# Patient Record
Sex: Male | Born: 1980 | Hispanic: Yes | Marital: Single | Smoking: Current every day smoker
Health system: Southern US, Community
[De-identification: ages and names within clinical notes are randomized; demographics above are authoritative.]

## PROBLEM LIST (undated history)

## (undated) DIAGNOSIS — J45909 Unspecified asthma, uncomplicated: Secondary | ICD-10-CM

## (undated) DIAGNOSIS — L0390 Cellulitis, unspecified: Secondary | ICD-10-CM

## (undated) HISTORY — PX: NO PAST SURGERIES: SHX2092

---

## 2015-09-03 DIAGNOSIS — L0390 Cellulitis, unspecified: Secondary | ICD-10-CM

## 2015-09-03 HISTORY — DX: Cellulitis, unspecified: L03.90

## 2015-09-08 ENCOUNTER — Emergency Department (HOSPITAL_COMMUNITY)
Admission: EM | Admit: 2015-09-08 | Discharge: 2015-09-09 | Disposition: A | Discharge status: Home / Self Care | Payer: Self-pay | Attending: Emergency Medicine | Admitting: Emergency Medicine

## 2015-09-08 ENCOUNTER — Encounter (HOSPITAL_COMMUNITY): Payer: Self-pay | Admitting: Emergency Medicine

## 2015-09-08 DIAGNOSIS — J020 Streptococcal pharyngitis: Secondary | ICD-10-CM | POA: Insufficient documentation

## 2015-09-08 DIAGNOSIS — J45909 Unspecified asthma, uncomplicated: Secondary | ICD-10-CM | POA: Insufficient documentation

## 2015-09-08 DIAGNOSIS — Z872 Personal history of diseases of the skin and subcutaneous tissue: Secondary | ICD-10-CM | POA: Insufficient documentation

## 2015-09-08 DIAGNOSIS — F17200 Nicotine dependence, unspecified, uncomplicated: Secondary | ICD-10-CM | POA: Insufficient documentation

## 2015-09-08 HISTORY — DX: Unspecified asthma, uncomplicated: J45.909

## 2015-09-08 LAB — RAPID STREP SCREEN (MED CTR MEBANE ONLY): STREPTOCOCCUS, GROUP A SCREEN (DIRECT): POSITIVE — AB

## 2015-09-08 MED ORDER — ACETAMINOPHEN 325 MG PO TABS
ORAL_TABLET | ORAL | Status: AC
  Administered 2015-09-08: 650 mg via ORAL
  Filled 2015-09-08: qty 2

## 2015-09-08 MED ORDER — PENICILLIN G BENZATHINE 1200000 UNIT/2ML IM SUSP
1.2000 10*6.[IU] | Freq: Once | INTRAMUSCULAR | Status: AC
  Administered 2015-09-09: 1.2 10*6.[IU] via INTRAMUSCULAR
  Filled 2015-09-08: qty 2

## 2015-09-08 MED ORDER — ACETAMINOPHEN 325 MG PO TABS
650.0000 mg | ORAL_TABLET | Freq: Once | ORAL | Status: AC
  Administered 2015-09-08: 650 mg via ORAL

## 2015-09-08 MED ORDER — KETOROLAC TROMETHAMINE 60 MG/2ML IM SOLN
60.0000 mg | Freq: Once | INTRAMUSCULAR | Status: AC
  Administered 2015-09-09: 60 mg via INTRAMUSCULAR
  Filled 2015-09-08: qty 2

## 2015-09-08 MED ORDER — IBUPROFEN 800 MG PO TABS: 800.0000 mg | ORAL_TABLET | Freq: Three times a day (TID) | ORAL | Status: DC

## 2015-09-08 NOTE — ED Provider Notes (Signed)
CSN: [id number]     Arrival date & time 09/08/15  1840 History  By signing my name below, I, Bethel Born, attest that this documentation has been prepared under the direction and in the presence of Gilda Crease, MD. Electronically Signed: Bethel Born, ED Scribe. 09/08/2015. 12:00 AM  Chief Complaint  Patient presents with  . Sore Throat  . Fever  . Headache    The history is provided by the patient. No language interpreter was used.   Joshua Keith is a 35 y.o. male who presents to the Emergency Department complaining of new and constant sore throat with gradual onset 4 days ago. Associated symptoms include fever, headache, and myalgias. Pt denies cough.   Past Medical History  Diagnosis Date  . Asthma    History reviewed. No pertinent past surgical history. No family history on file. Social History  Substance Use Topics  . Smoking status: Current Every Day Smoker  . Smokeless tobacco: None  . Alcohol Use: Yes    Review of Systems  Constitutional: Positive for fever.  HENT: Positive for sore throat.   Respiratory: Negative for cough.   Musculoskeletal: Positive for myalgias.  Neurological: Positive for headaches.  All other systems reviewed and are negative.   Allergies  Review of patient's allergies indicates no known allergies.  Home Medications   Prior to Admission medications   Medication Sig Start Date End Date Taking? Authorizing Provider  ibuprofen (ADVIL,MOTRIN) 800 MG tablet Take 1 tablet (800 mg total) by mouth 3 (three) times daily. 09/08/15   Gilda Crease, MD   BP 120/64 mmHg  Pulse 95  Temp(Src) 98.4 F (36.9 C) (Oral)  Resp 16  Wt 221 lb 2 oz (100.302 kg)  SpO2 100% Physical Exam  Constitutional: He is oriented to person, place, and time. He appears well-developed and well-nourished. No distress.  HENT:  Head: Normocephalic and atraumatic.  Right Ear: Hearing normal.  Left Ear: Hearing normal.  Nose: Nose  normal.  Mouth/Throat: Oropharynx is clear and moist and mucous membranes are normal.  Erythema diffusely of the soft palette and tonsils.   Eyes: Conjunctivae and EOM are normal. Pupils are equal, round, and reactive to light.  Neck: Normal range of motion. Neck supple.  Cardiovascular: Regular rhythm, S1 normal and S2 normal.  Exam reveals no gallop and no friction rub.   No murmur heard. Pulmonary/Chest: Effort normal and breath sounds normal. No respiratory distress. He exhibits no tenderness.  Abdominal: Soft. Normal appearance and bowel sounds are normal. There is no hepatosplenomegaly. There is no tenderness. There is no rebound, no guarding, no tenderness at McBurney's point and negative Murphy's sign. No hernia.  Musculoskeletal: Normal range of motion.  Lymphadenopathy:    He has cervical adenopathy (left anterior).  Neurological: He is alert and oriented to person, place, and time. He has normal strength. No cranial nerve deficit or sensory deficit. Coordination normal. GCS eye subscore is 4. GCS verbal subscore is 5. GCS motor subscore is 6.  Skin: Skin is warm, dry and intact. No rash noted. No cyanosis.  Psychiatric: He has a normal mood and affect. His speech is normal and behavior is normal. Thought content normal.  Nursing note and vitals reviewed.   ED Course  Procedures (including critical care time) DIAGNOSTIC STUDIES: Oxygen Saturation is 100% on RA,  normal by my interpretation.    COORDINATION OF CARE: 11:58 PM Discussed treatment plan which includes rapid strep screen, Bicillin, and Toradol with pt at  bedside and pt agreed to plan.  Labs Review Labs Reviewed  RAPID STREP SCREEN (NOT AT [facility]) - Abnormal; Notable for the following:    Streptococcus, Group A Screen (Direct) POSITIVE (*)    All other components within normal limits    Imaging Review No results found. I have personally reviewed and evaluated these  lab results as part of my medical  decision-making.   EKG Interpretation None      MDM   Final diagnoses:  Strep throat   I personally performed the services described in this documentation, which was scribed in my presence. The recorded information has been reviewed and is accurate.     Gilda Crease, MD 09/09/15 [phone]

## 2015-09-08 NOTE — ED Notes (Signed)
Pt. reports left sore throat with headache and fever onset yesterday .

## 2015-09-08 NOTE — Discharge Instructions (Signed)
Dolor de garganta  (Sore Throat)  El dolor de garganta es el dolor, ardor, irritacin o sensacin de picazn en la garganta. Generalmente hay dolor o molestias al tragar o hablar. Un dolor de garganta puede estar acompaado de otros sntomas, como tos, estornudos, fiebre y ganglios hinchados en el cuello. Generalmente es el primer signo de otra enfermedad, como un resfrio, gripe, anginas o mononucleosis (conocida como mono). La mayor parte de los dolores de garganta desaparecen sin tratamiento mdico. CAUSAS  Las causas ms comunes de dolor de garganta son:   Infecciones virales, como un resfrio, gripe o mononucleosis.  Infeccin bacteriana, como faringitis estreptoccica, amigdalitis, o tos ferina.  Alergias estacionales.  La sequedad en el aire.  Algunos irritantes, como el humo o la polucin.  Reflujo gastroesofgico. INSTRUCCIONES PARA EL CUIDADO EN EL HOGAR   Tome slo la medicacin que le indic el mdico.  Debe ingerir gran cantidad de lquido para mantener la orina de tono claro o color amarillo plido.  Descanse todo lo que sea necesario.  Trate de usar aerosoles para la garganta, pastillas o chupe caramelos duros para aliviar el dolor (si es mayor de 4 aos o segn lo que le indiquen).  Beba lquidos calientes, como caldos, infusiones de hierbas o agua caliente con miel para calmar el dolor momentneamente. Tambin puede comer o beber lquidos fros o congelados tales como paletas de hielo congelado.  Haga grgaras con agua con sal (mezclar 1 cucharadita de sal en 8 onzas [250 cm3] de agua).  No fume, y evite el humo de otros fumadores.  Ponga un humidificador de vapor fro en la habitacin por la noche para humedecer el aire. Tambin se puede activar en una ducha de agua caliente y sentarse en el bao con la puerta cerrada durante 5-10 minutos. SOLICITE ATENCIN MDICA DE INMEDIATO SI:   Tiene dificultad para respirar.  No puede tragar lquidos, alimentos blandos, o  su saliva.  Usted tiene ms inflamacin en la garganta.  El dolor de garganta no mejora en 7 das.  Tiene nuseas o vmitos.  Tiene fiebre o sntomas que persisten durante ms de 2 o 3 das.  Tiene fiebre y los sntomas empeoran de manera sbita. ASEGRESE DE QUE:   Comprende estas instrucciones.  Controlar su enfermedad.  Solicitar ayuda de inmediato si no mejora o si empeora.   Esta informacin no tiene como fin reemplazar el consejo del mdico. Asegrese de hacerle al mdico cualquier pregunta que tenga.   Document Released: 07/19/2005 Document Revised: 07/05/2012 Elsevier Interactive Patient Education 2016 [employer]  

## 2015-09-10 ENCOUNTER — Emergency Department (HOSPITAL_COMMUNITY): Payer: Self-pay

## 2015-09-10 ENCOUNTER — Encounter (HOSPITAL_COMMUNITY): Payer: Self-pay | Admitting: Family Medicine

## 2015-09-10 ENCOUNTER — Observation Stay (HOSPITAL_COMMUNITY)
Admission: EM | Admit: 2015-09-10 | Discharge: 2015-09-11 | Disposition: A | Discharge status: Home / Self Care | Payer: Self-pay | Attending: Internal Medicine | Admitting: Internal Medicine

## 2015-09-10 DIAGNOSIS — R109 Unspecified abdominal pain: Secondary | ICD-10-CM | POA: Insufficient documentation

## 2015-09-10 DIAGNOSIS — R000 Tachycardia, unspecified: Secondary | ICD-10-CM | POA: Insufficient documentation

## 2015-09-10 DIAGNOSIS — M546 Pain in thoracic spine: Secondary | ICD-10-CM

## 2015-09-10 DIAGNOSIS — B9689 Other specified bacterial agents as the cause of diseases classified elsewhere: Secondary | ICD-10-CM

## 2015-09-10 DIAGNOSIS — L0390 Cellulitis, unspecified: Secondary | ICD-10-CM

## 2015-09-10 DIAGNOSIS — R740 Nonspecific elevation of levels of transaminase and lactic acid dehydrogenase [LDH]: Secondary | ICD-10-CM

## 2015-09-10 DIAGNOSIS — J189 Pneumonia, unspecified organism: Secondary | ICD-10-CM | POA: Diagnosis present

## 2015-09-10 DIAGNOSIS — M25511 Pain in right shoulder: Secondary | ICD-10-CM

## 2015-09-10 DIAGNOSIS — R059 Cough, unspecified: Secondary | ICD-10-CM

## 2015-09-10 DIAGNOSIS — L03119 Cellulitis of unspecified part of limb: Secondary | ICD-10-CM

## 2015-09-10 DIAGNOSIS — R05 Cough: Secondary | ICD-10-CM

## 2015-09-10 DIAGNOSIS — R0781 Pleurodynia: Secondary | ICD-10-CM

## 2015-09-10 DIAGNOSIS — J129 Viral pneumonia, unspecified: Secondary | ICD-10-CM | POA: Insufficient documentation

## 2015-09-10 DIAGNOSIS — E876 Hypokalemia: Secondary | ICD-10-CM

## 2015-09-10 DIAGNOSIS — L03114 Cellulitis of left upper limb: Principal | ICD-10-CM

## 2015-09-10 DIAGNOSIS — J4520 Mild intermittent asthma, uncomplicated: Secondary | ICD-10-CM | POA: Insufficient documentation

## 2015-09-10 DIAGNOSIS — F1410 Cocaine abuse, uncomplicated: Secondary | ICD-10-CM

## 2015-09-10 DIAGNOSIS — F17200 Nicotine dependence, unspecified, uncomplicated: Secondary | ICD-10-CM

## 2015-09-10 HISTORY — DX: Cellulitis, unspecified: L03.90

## 2015-09-10 LAB — CBC
HCT: 42.7 % (ref 39.0–52.0)
Hemoglobin: 14.3 g/dL (ref 13.0–17.0)
MCH: 27.5 pg (ref 26.0–34.0)
MCHC: 33.5 g/dL (ref 30.0–36.0)
MCV: 82.1 fL (ref 78.0–100.0)
PLATELETS: 244 10*3/uL (ref 150–400)
RBC: 5.2 MIL/uL (ref 4.22–5.81)
RDW: 12.6 % (ref 11.5–15.5)
WBC: 13.3 10*3/uL — AB (ref 4.0–10.5)

## 2015-09-10 LAB — COMPREHENSIVE METABOLIC PANEL
ALBUMIN: 2.9 g/dL — AB (ref 3.5–5.0)
ALT: 78 U/L — AB (ref 17–63)
AST: 43 U/L — AB (ref 15–41)
Alkaline Phosphatase: 115 U/L (ref 38–126)
Anion gap: 12 (ref 5–15)
BILIRUBIN TOTAL: 0.7 mg/dL (ref 0.3–1.2)
BUN: 5 mg/dL — AB (ref 6–20)
CALCIUM: 8.9 mg/dL (ref 8.9–10.3)
CHLORIDE: 96 mmol/L — AB (ref 101–111)
CO2: 23 mmol/L (ref 22–32)
CREATININE: 0.91 mg/dL (ref 0.61–1.24)
GFR calc Af Amer: >60 mL/min (ref >60)
GFR calc non Af Amer: >60 mL/min (ref >60)
Glucose, Bld: 164 mg/dL — ABNORMAL HIGH (ref 65–99)
Potassium: 3.3 mmol/L — ABNORMAL LOW (ref 3.5–5.1)
Sodium: 131 mmol/L — ABNORMAL LOW (ref 135–145)
TOTAL PROTEIN: 6.8 g/dL (ref 6.5–8.1)

## 2015-09-10 LAB — LIPASE, BLOOD: Lipase: 28 U/L (ref 11–51)

## 2015-09-10 LAB — I-STAT CG4 LACTIC ACID, ED
LACTIC ACID, VENOUS: 0.88 mmol/L (ref 0.5–2.0)
Lactic Acid, Venous: 0.85 mmol/L (ref 0.5–2.0)

## 2015-09-10 LAB — RAPID URINE DRUG SCREEN, HOSP PERFORMED
AMPHETAMINES: NOT DETECTED
Barbiturates: NOT DETECTED
Benzodiazepines: NOT DETECTED
Cocaine: NOT DETECTED
OPIATES: POSITIVE — AB
Tetrahydrocannabinol: NOT DETECTED

## 2015-09-10 LAB — URIC ACID: URIC ACID, SERUM: 4.7 mg/dL (ref 4.4–7.6)

## 2015-09-10 LAB — TROPONIN I: Troponin I: <0.03 ng/mL (ref <0.031)

## 2015-09-10 LAB — MRSA PCR SCREENING: MRSA by PCR: NEGATIVE

## 2015-09-10 MED ORDER — ACETAMINOPHEN 650 MG RE SUPP: 650.0000 mg | Freq: Four times a day (QID) | RECTAL | Status: DC | PRN

## 2015-09-10 MED ORDER — HYDROCODONE-ACETAMINOPHEN 5-325 MG PO TABS
1.0000 | ORAL_TABLET | Freq: Four times a day (QID) | ORAL | Status: DC | PRN
  Administered 2015-09-11 (×2): 1 via ORAL
  Filled 2015-09-10 (×3): qty 1

## 2015-09-10 MED ORDER — IOHEXOL 350 MG/ML SOLN
100.0000 mL | Freq: Once | INTRAVENOUS | Status: AC | PRN
  Administered 2015-09-10: 75 mL via INTRAVENOUS

## 2015-09-10 MED ORDER — KETOROLAC TROMETHAMINE 30 MG/ML IJ SOLN
30.0000 mg | Freq: Four times a day (QID) | INTRAMUSCULAR | Status: DC | PRN
  Administered 2015-09-10 – 2015-09-11 (×2): 30 mg via INTRAVENOUS
  Filled 2015-09-10 (×2): qty 1

## 2015-09-10 MED ORDER — CEFAZOLIN SODIUM-DEXTROSE 2-3 GM-% IV SOLR
2.0000 g | Freq: Three times a day (TID) | INTRAVENOUS | Status: DC
  Administered 2015-09-10 – 2015-09-11 (×2): 2 g via INTRAVENOUS
  Filled 2015-09-10 (×4): qty 50

## 2015-09-10 MED ORDER — DEXTROSE 5 % IV SOLN
100.0000 mg | Freq: Once | INTRAVENOUS | Status: AC
  Administered 2015-09-10: 100 mg via INTRAVENOUS
  Filled 2015-09-10: qty 100

## 2015-09-10 MED ORDER — ACETAMINOPHEN 325 MG PO TABS
650.0000 mg | ORAL_TABLET | Freq: Four times a day (QID) | ORAL | Status: DC | PRN
  Administered 2015-09-10: 650 mg via ORAL
  Filled 2015-09-10: qty 2

## 2015-09-10 MED ORDER — SODIUM CHLORIDE 0.9 % IV SOLN
INTRAVENOUS | Status: AC
  Administered 2015-09-10: 17:00:00 via INTRAVENOUS

## 2015-09-10 MED ORDER — SODIUM CHLORIDE 0.9 % IV BOLUS (SEPSIS)
1000.0000 mL | Freq: Once | INTRAVENOUS | Status: AC
  Administered 2015-09-10: 1000 mL via INTRAVENOUS

## 2015-09-10 MED ORDER — POTASSIUM CHLORIDE CRYS ER 20 MEQ PO TBCR
40.0000 meq | EXTENDED_RELEASE_TABLET | Freq: Once | ORAL | Status: AC
  Administered 2015-09-10: 40 meq via ORAL
  Filled 2015-09-10: qty 2

## 2015-09-10 MED ORDER — SODIUM CHLORIDE 0.9% FLUSH: 3.0000 mL | INTRAVENOUS | Status: DC | PRN

## 2015-09-10 MED ORDER — MORPHINE SULFATE (PF) 4 MG/ML IV SOLN
4.0000 mg | Freq: Once | INTRAVENOUS | Status: AC
  Administered 2015-09-10: 4 mg via INTRAVENOUS
  Filled 2015-09-10: qty 1

## 2015-09-10 NOTE — ED Notes (Signed)
PT been running fevers since Monday; redness and swelling and tenderness and warmth to the R hand and wrist. Limited ROM  To hand and wrist.

## 2015-09-10 NOTE — Progress Notes (Signed)
Patient arrived to floor from ED. Report received from [place], [place]. Patient stable alert and oriented.

## 2015-09-10 NOTE — ED Notes (Signed)
Borders of hand and wrist marked with skin marker at this time.

## 2015-09-10 NOTE — ED Notes (Signed)
EDPA made aware of pain 10/10

## 2015-09-10 NOTE — ED Notes (Signed)
MD at bedside. 

## 2015-09-10 NOTE — ED Notes (Signed)
Admitting physician leaves bedside at this time.

## 2015-09-10 NOTE — Consult Note (Signed)
Reason for Consult:left hand and wrist pain and swelling Referring Physician: [facility] Joshua Keith is an 35 y.o. male.  HPI: as above with recent h/o left wrist volar pain and swelling  Past Medical History  Diagnosis Date  . Asthma     History reviewed. No pertinent past surgical history.  History reviewed. No pertinent family history.  Social History:  reports that he has been smoking.  He does not have any smokeless tobacco history on file. He reports that he drinks alcohol. He reports that he uses illicit drugs (Cocaine).  Allergies: No Known Allergies  Medications: Prior to Admission:  (Not in a hospital admission)  Results for orders placed or performed during the hospital encounter of 09/10/15 (from the past 48 hour(s))  Lipase, blood     Status: None   Collection Time: 09/10/15  9:11 AM  Result Value Ref Range   Lipase 28 11 - 51 U/L  Comprehensive metabolic panel     Status: Abnormal   Collection Time: 09/10/15  9:11 AM  Result Value Ref Range   Sodium 131 (L) 135 - 145 mmol/L   Potassium 3.3 (L) 3.5 - 5.1 mmol/L   Chloride 96 (L) 101 - 111 mmol/L   CO2 23 22 - 32 mmol/L   Glucose, Bld 164 (H) 65 - 99 mg/dL   BUN 5 (L) 6 - 20 mg/dL   Creatinine, Ser 0.91 0.61 - 1.24 mg/dL   Calcium 8.9 8.9 - 10.3 mg/dL   Total Protein 6.8 6.5 - 8.1 g/dL   Albumin 2.9 (L) 3.5 - 5.0 g/dL   AST 43 (H) 15 - 41 U/L   ALT 78 (H) 17 - 63 U/L   Alkaline Phosphatase 115 38 - 126 U/L   Total Bilirubin 0.7 0.3 - 1.2 mg/dL   GFR calc non Af Amer >60 >60 mL/min   GFR calc Af Amer >60 >60 mL/min    Comment: (NOTE) The eGFR has been calculated using the CKD EPI equation. This calculation has not been validated in all clinical situations. eGFR's persistently <60 mL/min signify possible Chronic Kidney Disease.    Anion gap 12 5 - 15  CBC     Status: Abnormal   Collection Time: 09/10/15  9:11 AM  Result Value Ref Range   WBC 13.3 (H) 4.0 - 10.5 K/uL   RBC 5.20 4.22 -  5.81 MIL/uL   Hemoglobin 14.3 13.0 - 17.0 g/dL   HCT 42.7 39.0 - 52.0 %   MCV 82.1 78.0 - 100.0 fL   MCH 27.5 26.0 - 34.0 pg   MCHC 33.5 30.0 - 36.0 g/dL   RDW 12.6 11.5 - 15.5 %   Platelets 244 150 - 400 K/uL  Uric acid     Status: None   Collection Time: 09/10/15 11:40 AM  Result Value Ref Range   Uric Acid, Serum 4.7 4.4 - 7.6 mg/dL  Troponin I     Status: None   Collection Time: 09/10/15 11:40 AM  Result Value Ref Range   Troponin I <0.03 <0.031 ng/mL    Comment:        NO INDICATION OF MYOCARDIAL INJURY.   I-Stat CG4 Lactic Acid, ED     Status: None   Collection Time: 09/10/15 11:55 AM  Result Value Ref Range   Lactic Acid, Venous 0.88 0.5 - 2.0 mmol/L  I-Stat CG4 Lactic Acid, ED     Status: None   Collection Time: 09/10/15  2:01 PM  Result Value  Ref Range   Lactic Acid, Venous 0.85 0.5 - 2.0 mmol/L    Dg Chest 2 View  09/10/2015  CLINICAL DATA:  Fever and left arm swelling EXAM: CHEST  2 VIEW COMPARISON:  None. FINDINGS: Lungs are clear. Heart size and pulmonary vascularity normal. No adenopathy. No bone lesions. IMPRESSION: No edema or consolidation. Electronically Signed   By: Lowella Grip III M.D.   On: 09/10/2015 09:16   Dg Wrist Complete Left  09/10/2015  CLINICAL DATA:  Redness, swelling, and pain in the left wrist beginning 2 days ago. Fever. No injury. EXAM: LEFT WRIST - COMPLETE 3+ VIEW COMPARISON:  None. FINDINGS: There is diffuse soft tissue swelling about the wrist. No acute fracture or dislocation is seen. Joint space widths are preserved. No osseous erosion. No radiopaque foreign body. Normal bone mineralization. IMPRESSION: Soft tissue swelling without acute osseous abnormality identified. Electronically Signed   By: Logan Bores M.D.   On: 09/10/2015 11:52   Ct Angio Chest Pe W/cm &/or Wo Cm  09/10/2015  CLINICAL DATA:  Right upper lateral chest pain and shortness of breath. EXAM: CT ANGIOGRAPHY CHEST WITH CONTRAST TECHNIQUE: Multidetector CT imaging of  the chest was performed using the standard protocol during bolus administration of intravenous contrast. Multiplanar CT image reconstructions and MIPs were obtained to evaluate the vascular anatomy. CONTRAST:  31m OMNIPAQUE IOHEXOL 350 MG/ML SOLN COMPARISON:  None. FINDINGS: Mediastinum/Lymph Nodes: Some of the most peripheral subsegmental pulmonary arteries are difficult to definitively characterize due to suboptimal contrast opacification and mild patient breathing motion artifact, but there is no pulmonary embolism identified within the main, lobar, segmental or central subsegmental pulmonary arteries bilaterally. Thoracic aorta is normal in caliber and configuration. No aortic aneurysm or dissection. Heart size is normal. No pericardial effusion. No mass or enlarged lymph nodes within the mediastinum or perihilar regions. Trachea and central bronchi are unremarkable. Lungs/Pleura: Small peripheral consolidations are seen with lower aspects of the left upper lobe and lateral aspects of the right middle lobe. Additional patchy ground-glass opacities are seen within the right lower lobe. No pleural effusion.  No pneumothorax. Upper abdomen: Limited images of the upper abdomen are unremarkable. Musculoskeletal: Superficial soft tissues are unremarkable. No osseous abnormality. Review of the MIP images confirms the above findings. IMPRESSION: 1. Small peripheral consolidations within the inferior aspect of the left upper lobe and lateral aspect of the right middle lobe, with additional ground-glass opacities within the right lower lobe. This could represent atypical infection such as fungal or viral, or respiratory bronchiolitis. Alternatively, these may merely represent areas of atelectasis. Lungs otherwise clear. No pleural effusion. 2. No pulmonary embolism seen, with mild study limitations detailed above. 3. No aortic aneurysm or dissection. 4. Heart size is normal.  No pericardial effusion. Electronically  Signed   By: SFranki CabotM.D.   On: 09/10/2015 13:17    Review of Systems  All other systems reviewed and are negative.  Blood pressure 142/90, pulse 95, temperature 98 F (36.7 C), temperature source Oral, resp. rate 22, SpO2 99 %. Physical Exam  Constitutional: He is oriented to person, place, and time. He appears well-developed and well-nourished.  HENT:  Head: Normocephalic and atraumatic.  Cardiovascular: Normal rate.   Respiratory: Effort normal.  Musculoskeletal:       Left wrist: He exhibits tenderness and swelling.  Mild left wrist volar erythema and swelling from flexion crease to distal 1/4 forearm  Kanavel signs negative  No evidence of deep abscess in palm or forearm  Most likely cellulitis   Neurological: He is alert and oriented to person, place, and time.  Skin: Skin is warm. There is erythema.  Psychiatric: He has a normal mood and affect. His behavior is normal. Judgment and thought content normal.    Assessment/Plan: As above  No signs of abscess at this point in time  Most likely cellulitis  Would admit for elevation and IV abx  Will follow with you  No surgical intervention needed at this point in time  [facility] 09/10/2015, 3:49 PM

## 2015-09-10 NOTE — ED Notes (Signed)
Physician remains at bedside.

## 2015-09-10 NOTE — ED Notes (Signed)
PA at bedside.

## 2015-09-10 NOTE — H&P (Signed)
Date: 09/10/2015               Patient Name:  Joshua Keith MRN: [id number]  DOB: [birth year] Age / Sex: 35 y.o., male   PCP: No primary care provider on file.              Medical Service: Internal Medicine Teaching Service              Attending Physician: Dr. Oval Linsey, MD    First Contact: Delena Serve, MS3 Pager: [phone]  Second Contact: Dr. Burgess Estelle Pager: [phone]  Third Contact Dr. Osa Craver Pager: [phone]       After Hours (After 5p/  First Contact Pager: [phone]  weekends / holidays): Second Contact Pager: [phone]   Chief Complaint:   History of Present Illness:  Joshua Keith is a 35 year old Hispanic male with history of intermittent asthma, tobacco, alcohol and recent cocaine use that presented with an erythematous left wrist on the volar aspect and right shoulder/right hypochondriac region . About a week ago he developed a sore throat that progressively worsened and he presented at [facility] ED on 09/08/2015. A rapid strep test was done, was consist with strep throat, and the patient was treated with a one time dose of IM penicillin G and given ibuprofen 838m PO for the diffuse myalgias which subsided with this medication. The next day he awoke to right sided mid-abdominal pain w/right shoulder pain, he took 2 Tylenol and tried to sleep but the pain did not ease off. The pain is rated as a 10/10, described it as having a waxing and waving nature, and has been constant since yesterday. He currently denies any throat pain, fever, diarrhea or chills though he was experience all of these symptoms earlier in the week.   The patient did not report any previous surgeries or family history of disease. He currently works in [employer] lives with several coworkers, and denies any sick contacts. The patient last use of cocaine was about one week ago and the method of administration is nasal. He also endorses smoking 1/2 a ppd and use alcohol  infrequently yet when he does drinks up to ~20 beers in a short span. He denies any sexual activity, recent travel, exposure to pets, swimming in bodies of water, or eating any unusual foods.  ROS is negative for n/v/blurry vision, chest pain, new rashes or SOB.      Meds: Current Facility-Administered Medications  Medication Dose Route Frequency Provider Last Rate Last Dose  . 0.9 %  sodium chloride infusion   Intravenous Continuous AFrancesca Oman DO 125 mL/hr at 09/10/15 1639    . acetaminophen (TYLENOL) tablet 650 mg  650 mg Oral Q6H PRN AFrancesca Oman DO   650 mg at 09/10/15 1617   Or  . acetaminophen (TYLENOL) suppository 650 mg  650 mg Rectal Q6H PRN AFrancesca Oman DO      . ketorolac (TORADOL) 30 MG/ML injection 30 mg  30 mg Intravenous Q6H PRN AFrancesca Oman DO      . sodium chloride flush (NS) 0.9 % injection 3 mL  3 mL Intravenous PRN AFrancesca Oman DO        Allergies: Allergies as of 09/10/2015  . (No Known Allergies)   Past Medical History  Diagnosis Date  . Asthma    History reviewed. No pertinent past surgical history. History reviewed. No pertinent family history. Social History   Social History  .  Marital Status: Single    Spouse Name: N/A  . Number of Children: N/A  . Years of Education: N/A   Occupational History  . Not on file.   Social History Main Topics  . Smoking status: Current Every Day Smoker  . Smokeless tobacco: Not on file  . Alcohol Use: Yes  . Drug Use: Yes    Special: Cocaine  . Sexual Activity: Not on file   Other Topics Concern  . Not on file   Social History Narrative    Review of Systems: Pertinent items are noted in HPI.  Physical Exam: Blood pressure 128/81, pulse 94, temperature 100 F (37.8 C), temperature source Oral, resp. rate 20, height _0  (1.753 m), weight 100.245 kg (221 lb), SpO2 98 %.  Physical Exam  Constitutional: He is oriented to person, place, and time. He appears distressed.  HENT:  Head:  Normocephalic.  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 Acanthosis nigricans and skin tags present around neck L>R. 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 Left eye exhibits no discharge. No scleral icterus.  Cardiovascular: Normal rate, regular rhythm and normal heart sounds.   No murmur heard. Pulmonary/Chest: Breath sounds normal. He has no wheezes. He has no rales.  Shallow breathing noted due to right sided pain  Abdominal: Soft. There is no rebound and no guarding.  Slight hyperactive bowel sounds. Moderate tenderness to light palpation in right hypochondriac region. Lower abdominal striae noted  Musculoskeletal:       Left wrist: He exhibits decreased range of motion, tenderness and swelling. He exhibits no crepitus.  Lymphadenopathy:    He has no cervical adenopathy.  Neurological: He is alert and oriented to person, place, and time.  Skin: Skin is warm and dry.  Right toes noted to be slightly darker than left toes.   Lab results: CBC    Component Value Date/Time   WBC 13.3* 09/10/2015 0911   RBC 5.20 09/10/2015 0911   HGB 14.3 09/10/2015 0911   HCT 42.7 09/10/2015 0911   PLT 244 09/10/2015 0911   MCV 82.1 09/10/2015 0911   MCH 27.5 09/10/2015 0911   MCHC 33.5 09/10/2015 0911   RDW 12.6 09/10/2015 0911   Hepatic Function Latest Ref Rng 09/10/2015  Total Protein 6.5 - 8.1 g/dL 6.8  Albumin 3.5 - 5.0 g/dL 2.9(L)  AST 15 - 41 U/L 43(H)  ALT 17 - 63 U/L 78(H)  Alk Phosphatase 38 - 126 U/L 115  Total Bilirubin 0.3 - 1.2 mg/dL 0.7      Imaging results:  Dg Chest 2 View  09/10/2015  CLINICAL DATA:  Fever and left arm swelling EXAM: CHEST  2 VIEW COMPARISON:  None. FINDINGS: Lungs are clear. Heart size and pulmonary vascularity normal. No adenopathy. No bone lesions. IMPRESSION: No edema or consolidation. Electronically Signed   By: Lowella Grip III M.D.   On: 09/10/2015 09:16   Dg  Wrist Complete Left  09/10/2015  CLINICAL DATA:  Redness, swelling, and pain in the left wrist beginning 2 days ago. Fever. No injury. EXAM: LEFT WRIST - COMPLETE 3+ VIEW COMPARISON:  None. FINDINGS: There is diffuse soft tissue swelling about the wrist. No acute fracture or dislocation is seen. Joint space widths are preserved. No osseous erosion. No radiopaque foreign body. Normal bone mineralization. IMPRESSION: Soft tissue swelling without acute osseous abnormality identified. Electronically Signed   By: Logan Bores M.D.   On: 09/10/2015  11:52   Ct Angio Chest Pe W/cm &/or Wo Cm  09/10/2015  CLINICAL DATA:  Right upper lateral chest pain and shortness of breath. EXAM: CT ANGIOGRAPHY CHEST WITH CONTRAST TECHNIQUE: Multidetector CT imaging of the chest was performed using the standard protocol during bolus administration of intravenous contrast. Multiplanar CT image reconstructions and MIPs were obtained to evaluate the vascular anatomy. CONTRAST:  15m OMNIPAQUE IOHEXOL 350 MG/ML SOLN COMPARISON:  None. FINDINGS: Mediastinum/Lymph Nodes: Some of the most peripheral subsegmental pulmonary arteries are difficult to definitively characterize due to suboptimal contrast opacification and mild patient breathing motion artifact, but there is no pulmonary embolism identified within the main, lobar, segmental or central subsegmental pulmonary arteries bilaterally. Thoracic aorta is normal in caliber and configuration. No aortic aneurysm or dissection. Heart size is normal. No pericardial effusion. No mass or enlarged lymph nodes within the mediastinum or perihilar regions. Trachea and central bronchi are unremarkable. Lungs/Pleura: Small peripheral consolidations are seen with lower aspects of the left upper lobe and lateral aspects of the right middle lobe. Additional patchy ground-glass opacities are seen within the right lower lobe. No pleural effusion.  No pneumothorax. Upper abdomen: Limited images of the upper  abdomen are unremarkable. Musculoskeletal: Superficial soft tissues are unremarkable. No osseous abnormality. Review of the MIP images confirms the above findings. IMPRESSION: 1. Small peripheral consolidations within the inferior aspect of the left upper lobe and lateral aspect of the right middle lobe, with additional ground-glass opacities within the right lower lobe. This could represent atypical infection such as fungal or viral, or respiratory bronchiolitis. Alternatively, these may merely represent areas of atelectasis. Lungs otherwise clear. No pleural effusion. 2. No pulmonary embolism seen, with mild study limitations detailed above. 3. No aortic aneurysm or dissection. 4. Heart size is normal.  No pericardial effusion. Electronically Signed   By: SFranki CabotM.D.   On: 09/10/2015 13:17      Assessment & Plan by Problem: Active Problems:   Cellulitis   Right shoulder pain   Hypokalemia   Acute right-sided thoracic back pain  Mr. AKatheran Aweis a 35year old Hispanic male with history of intermittent asthma, tobacco, alcohol and recent cocaine use that presented with an erythematous left wrist on the volar aspect and right shoulder/right hypochondriac region. Even though he has a recent history of strep throat, I have low clinical suspicion that it is associated with his presentation of a right sided pleuritic chest pain that radiates to the shoulder and left hand/wrist cellulitis. On exam the patient did have macule around the area of swelling that could have been from a bug bite which raises the possibility of vector associated infection.  Labs and imaging done in ED showed a mild leukocytosis, ALT 78: AST 43 and CT scan showed ground glass opacitiesthat may point towards a viral etiology for his pneumonitis. We will continue to assess for other causes for this patient presentation.   Right pleuritic flank pain -At this time we believe a viral pneumonitis could be the cause of presentation.  The pain can be addressed with NSAIDS w/reassesment to see if there are other causes for this problem.  Left hand/wrist cellulitis -Will f/u orthopedic surgery recommendation, but at this time surgical intervention is not indicated. Blood cultures are pending at this time. Patient is on IV Cefazolin for gram+(i.e Staph) and some gram negative coverage.  Hypokalemia -F/u with AM CMP for resolution of low K+ on admission at 3.3. 40 mEq of K+ given in ED, expect  0.25 increase with each 23mq. On repeat K+ should be ~3.8     This is a MCareers information officerNote.  The care of the patient was discussed with Dr.  and the assessment and plan was formulated with their assistance.  Please see their note for official documentation of the patient encounter.   Signed: SDelena Serve, Med Student 09/10/2015, 5:22 PM

## 2015-09-10 NOTE — ED Notes (Signed)
Hospitalist at bedside 

## 2015-09-10 NOTE — ED Notes (Signed)
Delice Bison, RN accepts report at this time.

## 2015-09-10 NOTE — ED Provider Notes (Signed)
CSN: [id number]     Arrival date & time 09/10/15  4098 History   First MD Initiated Contact with Patient 09/10/15 1029     Chief Complaint  Patient presents with  . Flank Pain     (Consider location/radiation/quality/duration/timing/severity/associated sxs/prior Treatment) Patient is a 35 y.o. male presenting with flank pain. The history is provided by the patient and medical records.  Flank Pain Associated symptoms include chest pain.   35 year old male with history of asthma, presenting to the ED for multiple complaints.  Patient was seen here Monday diagnosed with strep throat, he was treated with Bicillin in the ED. He reports his throat is feeling better at this time. Now he has pain in his right chest and right ribs which is radiating to his right shoulder. He states he has pain with movement and inspiration. States he intermittently feels SOB.  He has no known cardiac history. Patient is a daily smoker and he occasionally uses cocaine.  Denies IVDU. Patient also drinks alcohol, none recently.  No recent travel, trauma, prolonged immobilization.  No hx of DVT or PE.  No abdominal pain, nausea, vomiting, diarrhea.  No urinary symptoms or hematuria.  Patient also has some redness and swelling to left volar wrist.  He states this started yesterday.  States he has pain with movement of the wrist.  Denies injuries or known trauma to the wrist.  No hx of gout to his knowledge.  Patient is right hand dominant.  Patient denies fever since Monday when diagnosed with strep.  Tachycardia noted on arrival, no current fever.  Past Medical History  Diagnosis Date  . Asthma    History reviewed. No pertinent past surgical history. History reviewed. No pertinent family history. Social History  Substance Use Topics  . Smoking status: Current Every Day Smoker  . Smokeless tobacco: None  . Alcohol Use: Yes    Review of Systems  Respiratory: Positive for shortness of breath.   Cardiovascular: Positive  for chest pain.  Genitourinary: Positive for flank pain.  All other systems reviewed and are negative.     Allergies  Review of patient's allergies indicates no known allergies.  Home Medications   Prior to Admission medications   Medication Sig Start Date End Date Taking? Authorizing Provider  ibuprofen (ADVIL,MOTRIN) 800 MG tablet Take 1 tablet (800 mg total) by mouth 3 (three) times daily. 09/08/15   Gilda Crease, MD   BP 123/74 mmHg  Pulse 91  Temp(Src) 98 F (36.7 C) (Oral)  Resp 18  SpO2 95%   Physical Exam  Constitutional: He is oriented to person, place, and time. He appears well-developed and well-nourished. No distress.  HENT:  Head: Normocephalic and atraumatic.  Mouth/Throat: Oropharynx is clear and moist.  Eyes: Conjunctivae and EOM are normal. Pupils are equal, round, and reactive to light.  Neck: Normal range of motion.  Cardiovascular: Normal rate, regular rhythm and normal heart sounds.   No murmurs or rubs noted  Pulmonary/Chest: Effort normal and breath sounds normal. No respiratory distress. He has no wheezes. He has no rhonchi. He has no rales.  Tenderness of right chest wall and right upper ribs; reports pain with inspiration; lungs overall clear  Abdominal: Soft. Bowel sounds are normal.  Musculoskeletal: Normal range of motion.  Left volar wrist with approx 3cm in diameter area of erythema, induration, and swelling; area is locally TTP but no tenderness to remainder of wrist; slight warmth to touch noted; patient is able to flex/extend wrist but  reports pain when doing so, difficulty making fist but fully extending all fingers; strong radial pulse and cap refill; sensation intact throughout wrist/hand  Neurological: He is alert and oriented to person, place, and time.  Skin: Skin is warm and dry. He is not diaphoretic.  Psychiatric: He has a normal mood and affect.  Nursing note and vitals reviewed.   ED Course  Procedures (including critical  care time) Labs Review Labs Reviewed  COMPREHENSIVE METABOLIC PANEL - Abnormal; Notable for the following:    Sodium 131 (*)    Potassium 3.3 (*)    Chloride 96 (*)    Glucose, Bld 164 (*)    BUN 5 (*)    Albumin 2.9 (*)    AST 43 (*)    ALT 78 (*)    All other components within normal limits  CBC - Abnormal; Notable for the following:    WBC 13.3 (*)    All other components within normal limits  CULTURE, BLOOD (ROUTINE X 2)  CULTURE, BLOOD (ROUTINE X 2)  LIPASE, BLOOD  URIC ACID  TROPONIN I  HIV ANTIBODY (ROUTINE TESTING)  URINE RAPID DRUG SCREEN, HOSP PERFORMED  I-STAT CG4 LACTIC ACID, ED  I-STAT CG4 LACTIC ACID, ED    Imaging Review Dg Chest 2 View  09/10/2015  CLINICAL DATA:  Fever and left arm swelling EXAM: CHEST  2 VIEW COMPARISON:  None. FINDINGS: Lungs are clear. Heart size and pulmonary vascularity normal. No adenopathy. No bone lesions. IMPRESSION: No edema or consolidation. Electronically Signed   By: Bretta Bang III M.D.   On: 09/10/2015 09:16   Dg Wrist Complete Left  09/10/2015  CLINICAL DATA:  Redness, swelling, and pain in the left wrist beginning 2 days ago. Fever. No injury. EXAM: LEFT WRIST - COMPLETE 3+ VIEW COMPARISON:  None. FINDINGS: There is diffuse soft tissue swelling about the wrist. No acute fracture or dislocation is seen. Joint space widths are preserved. No osseous erosion. No radiopaque foreign body. Normal bone mineralization. IMPRESSION: Soft tissue swelling without acute osseous abnormality identified. Electronically Signed   By: Sebastian Ache M.D.   On: 09/10/2015 11:52   Ct Angio Chest Pe W/cm &/or Wo Cm  09/10/2015  CLINICAL DATA:  Right upper lateral chest pain and shortness of breath. EXAM: CT ANGIOGRAPHY CHEST WITH CONTRAST TECHNIQUE: Multidetector CT imaging of the chest was performed using the standard protocol during bolus administration of intravenous contrast. Multiplanar CT image reconstructions and MIPs were obtained to evaluate  the vascular anatomy. CONTRAST:  75mL OMNIPAQUE IOHEXOL 350 MG/ML SOLN COMPARISON:  None. FINDINGS: Mediastinum/Lymph Nodes: Some of the most peripheral subsegmental pulmonary arteries are difficult to definitively characterize due to suboptimal contrast opacification and mild patient breathing motion artifact, but there is no pulmonary embolism identified within the main, lobar, segmental or central subsegmental pulmonary arteries bilaterally. Thoracic aorta is normal in caliber and configuration. No aortic aneurysm or dissection. Heart size is normal. No pericardial effusion. No mass or enlarged lymph nodes within the mediastinum or perihilar regions. Trachea and central bronchi are unremarkable. Lungs/Pleura: Small peripheral consolidations are seen with lower aspects of the left upper lobe and lateral aspects of the right middle lobe. Additional patchy ground-glass opacities are seen within the right lower lobe. No pleural effusion.  No pneumothorax. Upper abdomen: Limited images of the upper abdomen are unremarkable. Musculoskeletal: Superficial soft tissues are unremarkable. No osseous abnormality. Review of the MIP images confirms the above findings. IMPRESSION: 1. Small peripheral consolidations within the inferior  aspect of the left upper lobe and lateral aspect of the right middle lobe, with additional ground-glass opacities within the right lower lobe. This could represent atypical infection such as fungal or viral, or respiratory bronchiolitis. Alternatively, these may merely represent areas of atelectasis. Lungs otherwise clear. No pleural effusion. 2. No pulmonary embolism seen, with mild study limitations detailed above. 3. No aortic aneurysm or dissection. 4. Heart size is normal.  No pericardial effusion. Electronically Signed   By: Bary Richard M.D.   On: 09/10/2015 13:17   I have personally reviewed and evaluated these images and lab results as part of my medical decision-making.   EKG  Interpretation None      MDM   Final diagnoses:  CAP (community acquired pneumonia)  Cellulitis of wrist   35 year old male here with left wrist pain and swelling as well as right-sided chest pain. He was seen here Monday, diagnosed with strep throat and treated appropriately with Bicillin IM. He reports he is continued for worse throughout the week. Patient is afebrile, nontoxic in appearance.  He is tachycardic. He has reproducible tenderness of right chest wall and right upper ribs, there is no acute deformity noted. His lungs are overall clear. He also has what appears to be a cellulitis of his left volar wrist with mild swelling. He is able to flex and extend the wrist, however it is painful. He has no erythema to dorsal wrist. His hand is neurovascularly intact.  No fluctuance or signs of abscess formation.  No hx of IVDU.  Lower suspicion for septic joint at this time. Patient's labs with leukocytosis, normal lactate. His troponin is negative. Chest x-ray and left wrist films clear. Uric acid also WNL.  CTA of chest was obtained due to his tachycardia and chest pain, no evidence of PE but there is concern for multifocal atypical pneumonia. Patient is currently oxygenating well, no O2 requirement noted.  It is somewhat atypical that patient has had 3 different infections occur over the past 3 days with his strep throat, pneumonia, and cellulitis. Patient denies any recent travel or sick exposures. He has no history of HIV or immunocompromised state to his knowledge. Given this and patient's lack of outpatient follow-up, I feel he would be better managed with observation and IV antibiotics. HIV and blood culture sent. Patient was started on doxycycline. Patient admitted to internal medicine service for further management.  Case discused with attending physician, Dr. Clarene Duke, who evaluated patient and agrees with assessment and plan of care.  Garlon Hatchet, PA-C 09/10/15 1601  Samuel Jester, DO 09/14/15 2123

## 2015-09-10 NOTE — Progress Notes (Signed)
Spoke to patient regarding primary care resources and the [facility] orange card. Orange card application provided and explained. Patient instructed to contact me once application is complete for an eligibility appointment. Uninsured primary care resource guide and my contact information also provided for any future questions or concerns. No other [facility] & Eligibility Specialist needs identified at this time.   Buddy Duty [facility] & Eligibility Specialist Partnership for [facility] [phone]

## 2015-09-10 NOTE — ED Notes (Signed)
Pt here for left arm swelling and pain. sts also right flank pain. Pt tachy at triage. sts fever.

## 2015-09-10 NOTE — H&P (Signed)
Date: 09/10/2015               Patient Name:  Joshua Keith MRN: [id number]  DOB: Dec 25, 1980 Age / Sex: 35 y.o., male   PCP: No primary care provider on file.         Medical Service: Internal Medicine Teaching Service         Attending Physician: Dr. Doneen Poisson, MD    First Contact: Dr. Deneise Lever Pager: [phone]  Second Contact: Dr. Jill Alexanders  Pager: [phone]       After Hours (After 5p/  First Contact Pager: [phone]  weekends / holidays): Second Contact Pager: [phone]   Chief Complaint: right shoulder pain   History of Present Illness:   35 yo [place] American patient with no significant past medical history here for right shoulder pain and right under arm pain, and also erythema of his left volar wrist.  Pt says that he was here in the ER on Monday for sore throat and fever and he tested positive for GAS and given bicillin shot and sent home on ibuprofen. Since then his fevers have subsided, but he started having the right shoulder pain since yesterday,. The pain is 10/10 and sharp and is mainly under his right arm in a dermatomal fashion. The pain stayed about the same overnight and it does not radiate anywhere else. He denies abdominal pain or chest pain. He denies shortness of breath. He denies any injury or trauma or heavy lifting. He said he noticed his pain yesterday when he was lifting his laundry. The pain worsens when he takes a deep breath and improves if he lays still. It is somewhat relieved by advil. Regarding his GAS, he denies any cough, sore throat, trouble swallowing, or rash.  He denies any neck pain or rigidity.   About his left wrist cellulitis, he says it started last Monday the day prior to him coming to the ER, but it has since progressed to the point where it is hard for him to flex his wrist. He denies any injury or trauma. He is unsure if he had an insect bite. He denies any hiking activity or going to the woods. He denies iV drug  use there.   Patient denies weight changes, change in appetite, night sweats, n/v. He did have some diarrhea that resolved 2 days ago, up to 3 loose bowel movements per day. He denies melena or hematochezia. No dysuria or hematuria. No penile discharge.   Family history: negative for diabetes and hypertension.  Social history: Patient drinks upto "24 beers at a time" but not every day, and last drink was last Friday. He smokes about 0.5 pack per day for atleast 5 years. He denies IV drug use now or in the past, but does snort cocaine. Last time used was last week. No other illicit drug use. He denies being sexually active with anyone in the last year, last activity at least "few years ago" and with woman.  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 He moved from [place] 17 years ago. No recent history of travel.   He does not have a PCP.   Meds: Current Facility-Administered Medications  Medication Dose Route Frequency Provider Last Rate Last Dose  . 0.9 %  sodium chloride infusion   Intravenous Continuous Yolanda Manges, DO      . acetaminophen (TYLENOL) tablet 650 mg  650 mg Oral Q6H PRN Yolanda Manges, DO  650 mg at 09/10/15 1617   Or  . acetaminophen (TYLENOL) suppository 650 mg  650 mg Rectal Q6H PRN Yolanda Manges, DO      . ketorolac (TORADOL) 30 MG/ML injection 30 mg  30 mg Intravenous Q6H PRN Yolanda Manges, DO      . sodium chloride flush (NS) 0.9 % injection 3 mL  3 mL Intravenous PRN Yolanda Manges, DO        Allergies: Allergies as of 09/10/2015  . (No Known Allergies)   Past Medical History  Diagnosis Date  . Asthma    History reviewed. No pertinent past surgical history. History reviewed. No pertinent family history. Social History   Social History  . Marital Status: Single    Spouse Name: N/A  . Number of Children: N/A  . Years of Education: N/A   Occupational History  . Not on file.   Social History Main Topics  . Smoking status: Current Every  Day Smoker  . Smokeless tobacco: Not on file  . Alcohol Use: Yes  . Drug Use: Yes    Special: Cocaine  . Sexual Activity: Not on file   Other Topics Concern  . Not on file   Social History Narrative    Review of Systems: Pertinent items noted in HPI and remainder of comprehensive ROS otherwise negative.  Physical Exam: Blood pressure 128/81, pulse 94, temperature 100 F (37.8 C), temperature source Oral, resp. rate 20, weight 221 lb (100.245 kg), SpO2 98 %.  General: Vital signs reviewed. HEENT: MMM, no lymphadenopathy, no exudates in pharynx, PERRLA  Cardiovascular: regular rate, rhythm, no murmur appreciated  Pulmonary/Chest: Clear to auscultation bilaterally, no wheezes, rales, or rhonchi. No rash on right under arm. Pain reproducible on palpation of right under arm Abdominal: Soft, non-tender, non-distended, BS + , no hepatosplenomegaly Extremities: No lower extremity edema bilaterally,     Hand: 3 cm erythema and swelling of left volar wrist, Tender to flexion and extension of wrist and also flexing of fingers. Radial pulse is intact    Lab results: Results for orders placed or performed during the hospital encounter of 09/10/15 (from the past 24 hour(s))  Lipase, blood     Status: None   Collection Time: 09/10/15  9:11 AM  Result Value Ref Range   Lipase 28 11 - 51 U/L  Comprehensive metabolic panel     Status: Abnormal   Collection Time: 09/10/15  9:11 AM  Result Value Ref Range   Sodium 131 (L) 135 - 145 mmol/L   Potassium 3.3 (L) 3.5 - 5.1 mmol/L   Chloride 96 (L) 101 - 111 mmol/L   CO2 23 22 - 32 mmol/L   Glucose, Bld 164 (H) 65 - 99 mg/dL   BUN 5 (L) 6 - 20 mg/dL   Creatinine, Ser 1.61 0.61 - 1.24 mg/dL   Calcium 8.9 8.9 - 09.6 mg/dL   Total Protein 6.8 6.5 - 8.1 g/dL   Albumin 2.9 (L) 3.5 - 5.0 g/dL   AST 43 (H) 15 - 41 U/L   ALT 78 (H) 17 - 63 U/L   Alkaline Phosphatase 115 38 - 126 U/L   Total Bilirubin 0.7 0.3 - 1.2 mg/dL   GFR calc non Af Amer >60  >60 mL/min   GFR calc Af Amer >60 >60 mL/min   Anion gap 12 5 - 15  CBC     Status: Abnormal   Collection Time: 09/10/15  9:11 AM  Result Value Ref Range  WBC 13.3 (H) 4.0 - 10.5 K/uL   RBC 5.20 4.22 - 5.81 MIL/uL   Hemoglobin 14.3 13.0 - 17.0 g/dL   HCT 16.1 09.6 - 04.5 %   MCV 82.1 78.0 - 100.0 fL   MCH 27.5 26.0 - 34.0 pg   MCHC 33.5 30.0 - 36.0 g/dL   RDW 40.9 81.1 - 91.4 %   Platelets 244 150 - 400 K/uL  Uric acid     Status: None   Collection Time: 09/10/15 11:40 AM  Result Value Ref Range   Uric Acid, Serum 4.7 4.4 - 7.6 mg/dL  Troponin I     Status: None   Collection Time: 09/10/15 11:40 AM  Result Value Ref Range   Troponin I <0.03 <0.031 ng/mL  I-Stat CG4 Lactic Acid, ED     Status: None   Collection Time: 09/10/15 11:55 AM  Result Value Ref Range   Lactic Acid, Venous 0.88 0.5 - 2.0 mmol/L  I-Stat CG4 Lactic Acid, ED     Status: None   Collection Time: 09/10/15  2:01 PM  Result Value Ref Range   Lactic Acid, Venous 0.85 0.5 - 2.0 mmol/L  Urine rapid drug screen (hosp performed)     Status: Abnormal   Collection Time: 09/10/15  4:11 PM  Result Value Ref Range   Opiates POSITIVE (A) NONE DETECTED   Cocaine NONE DETECTED NONE DETECTED   Benzodiazepines NONE DETECTED NONE DETECTED   Amphetamines NONE DETECTED NONE DETECTED   Tetrahydrocannabinol NONE DETECTED NONE DETECTED   Barbiturates NONE DETECTED NONE DETECTED     Imaging results:  Dg Chest 2 View  09/10/2015  CLINICAL DATA:  Fever and left arm swelling EXAM: CHEST  2 VIEW COMPARISON:  None. FINDINGS: Lungs are clear. Heart size and pulmonary vascularity normal. No adenopathy. No bone lesions. IMPRESSION: No edema or consolidation. Electronically Signed   By: Bretta Bang III M.D.   On: 09/10/2015 09:16   Dg Wrist Complete Left  09/10/2015  CLINICAL DATA:  Redness, swelling, and pain in the left wrist beginning 2 days ago. Fever. No injury. EXAM: LEFT WRIST - COMPLETE 3+ VIEW COMPARISON:  None.  FINDINGS: There is diffuse soft tissue swelling about the wrist. No acute fracture or dislocation is seen. Joint space widths are preserved. No osseous erosion. No radiopaque foreign body. Normal bone mineralization. IMPRESSION: Soft tissue swelling without acute osseous abnormality identified. Electronically Signed   By: Sebastian Ache M.D.   On: 09/10/2015 11:52   Ct Angio Chest Pe W/cm &/or Wo Cm  09/10/2015  CLINICAL DATA:  Right upper lateral chest pain and shortness of breath. EXAM: CT ANGIOGRAPHY CHEST WITH CONTRAST TECHNIQUE: Multidetector CT imaging of the chest was performed using the standard protocol during bolus administration of intravenous contrast. Multiplanar CT image reconstructions and MIPs were obtained to evaluate the vascular anatomy. CONTRAST:  75mL OMNIPAQUE IOHEXOL 350 MG/ML SOLN COMPARISON:  None. FINDINGS: Mediastinum/Lymph Nodes: Some of the most peripheral subsegmental pulmonary arteries are difficult to definitively characterize due to suboptimal contrast opacification and mild patient breathing motion artifact, but there is no pulmonary embolism identified within the main, lobar, segmental or central subsegmental pulmonary arteries bilaterally. Thoracic aorta is normal in caliber and configuration. No aortic aneurysm or dissection. Heart size is normal. No pericardial effusion. No mass or enlarged lymph nodes within the mediastinum or perihilar regions. Trachea and central bronchi are unremarkable. Lungs/Pleura: Small peripheral consolidations are seen with lower aspects of the left upper lobe and lateral aspects of  the right middle lobe. Additional patchy ground-glass opacities are seen within the right lower lobe. No pleural effusion.  No pneumothorax. Upper abdomen: Limited images of the upper abdomen are unremarkable. Musculoskeletal: Superficial soft tissues are unremarkable. No osseous abnormality. Review of the MIP images confirms the above findings. IMPRESSION: 1. Small  peripheral consolidations within the inferior aspect of the left upper lobe and lateral aspect of the right middle lobe, with additional ground-glass opacities within the right lower lobe. This could represent atypical infection such as fungal or viral, or respiratory bronchiolitis. Alternatively, these may merely represent areas of atelectasis. Lungs otherwise clear. No pleural effusion. 2. No pulmonary embolism seen, with mild study limitations detailed above. 3. No aortic aneurysm or dissection. 4. Heart size is normal.  No pericardial effusion. Electronically Signed   By: Bary Richard M.D.   On: 09/10/2015 13:17     Assessment & Plan by Problem: Active Problems:   Cellulitis   Right shoulder pain   Hypokalemia   Acute right-sided thoracic back pain  Right pleuritic chest pain: unclear etiology- could be viral atypical pneumonia. His EKG entirely normal.  His troponin normal. Dont think it is PE.  It is unlikely to be GA strep as we do not see any consolidation on the ct angio. Pt has mainly ground glass opacities in the right lower lobe.  His pain is probably where the ground glass opacities are seen on CT angio. He also could have lifted something heavy.  I do not think it is shingles as pt does not appear to be immunocompromised and he had no systemic symptoms. He had no weight loss.  He denies any productive cough, fevers after Monday or other symptoms. Patient denies any travel, and no sick exposures. He has no history of HIV though has never been tested.   -given toradol, and then scheduled naproxen 500 mg bid starting tomorrow as antiinflammatory -tylenol  -continue doxycycline  -on NS 125 cc/hr -ordered blood cultures -UDS -gram stain and sputum culture  -HIV screening  -ordered flu panel -repeat CBC and CMET   Left volar wrist erythema and swelling likely cellulitis: Pt who says this started since Monday and it is gradually spreading.  Has mild leukocytosis but is afebrile.  Symptoms like tenosynovitis as pt had trouble flexing his fingers or flexing and extending his wrist. His pulses were intact and did not think there was any abscess.  I am not sure how this could have started as pt denies any trauma, IV drug use there or injury. No known history of tick bites, thought there was a small pinpoint place where there may have been an insect bite. We consulted hand surgery who recommended continuing iv antibiotics and obs overnight.  -given toradol  -on doxy   Mild transaminitis: Could be due to tylenol or NAFLD. Unlikely to be alcohol even though pt is a binge drinker up to 24 beers a day. ALT of 78 and AST of 43. UDS positive for opiates. Lipase normal   -repeat CMET -CSW consult for alcohol use disorder counseling resources   Hypokalemia Given 40 mew klor con     Dispo: Disposition is deferred at this time, awaiting improvement of current medical problems. Anticipated discharge in approximately 1 day(s).   The patient does not have a current PCP (No primary care provider on file.) and does need an [facility] hospital follow-up appointment after discharge.  The patient does not have transportation limitations that hinder transportation to clinic appointments.  Signed:  Deneise Lever, MD 09/10/2015, 4:30 PM

## 2015-09-11 ENCOUNTER — Encounter (HOSPITAL_COMMUNITY): Payer: Self-pay | Admitting: General Practice

## 2015-09-11 DIAGNOSIS — J129 Viral pneumonia, unspecified: Secondary | ICD-10-CM

## 2015-09-11 LAB — CBC WITH DIFFERENTIAL/PLATELET
BLASTS: 0 %
Band Neutrophils: 7 %
Basophils Absolute: 0 10*3/uL (ref 0.0–0.1)
Basophils Relative: 0 %
EOS PCT: 1 %
Eosinophils Absolute: 0.1 10*3/uL (ref 0.0–0.7)
HEMATOCRIT: 39.6 % (ref 39.0–52.0)
Hemoglobin: 13.4 g/dL (ref 13.0–17.0)
LYMPHS ABS: 3.3 10*3/uL (ref 0.7–4.0)
LYMPHS PCT: 25 %
MCH: 28.1 pg (ref 26.0–34.0)
MCHC: 33.8 g/dL (ref 30.0–36.0)
MCV: 83 fL (ref 78.0–100.0)
MONOS PCT: 8 %
MYELOCYTES: 0 %
Metamyelocytes Relative: 1 %
Monocytes Absolute: 1.1 10*3/uL — ABNORMAL HIGH (ref 0.1–1.0)
NRBC: 0 /100{WBCs}
Neutro Abs: 8.7 10*3/uL — ABNORMAL HIGH (ref 1.7–7.7)
Neutrophils Relative %: 58 %
Other: 0 %
PLATELETS: 274 10*3/uL (ref 150–400)
Promyelocytes Absolute: 0 %
RBC: 4.77 MIL/uL (ref 4.22–5.81)
RDW: 13 % (ref 11.5–15.5)
WBC: 13.2 10*3/uL — AB (ref 4.0–10.5)

## 2015-09-11 LAB — COMPREHENSIVE METABOLIC PANEL
ALK PHOS: 111 U/L (ref 38–126)
ALT: 50 U/L (ref 17–63)
AST: 23 U/L (ref 15–41)
Albumin: 2.4 g/dL — ABNORMAL LOW (ref 3.5–5.0)
Anion gap: 10 (ref 5–15)
BILIRUBIN TOTAL: 0.5 mg/dL (ref 0.3–1.2)
CALCIUM: 8.4 mg/dL — AB (ref 8.9–10.3)
CHLORIDE: 98 mmol/L — AB (ref 101–111)
CO2: 27 mmol/L (ref 22–32)
CREATININE: 0.77 mg/dL (ref 0.61–1.24)
Glucose, Bld: 107 mg/dL — ABNORMAL HIGH (ref 65–99)
Potassium: 3.4 mmol/L — ABNORMAL LOW (ref 3.5–5.1)
Sodium: 135 mmol/L (ref 135–145)
TOTAL PROTEIN: 6.7 g/dL (ref 6.5–8.1)

## 2015-09-11 LAB — INFLUENZA PANEL BY PCR (TYPE A & B)
H1N1 flu by pcr: NOT DETECTED
INFLAPCR: NEGATIVE
Influenza B By PCR: NEGATIVE

## 2015-09-11 LAB — HIV ANTIBODY (ROUTINE TESTING W REFLEX): HIV SCREEN 4TH GENERATION: NONREACTIVE

## 2015-09-11 MED ORDER — CEPHALEXIN 500 MG PO CAPS: 500.0000 mg | ORAL_CAPSULE | Freq: Four times a day (QID) | ORAL | Status: DC

## 2015-09-11 MED ORDER — NAPROXEN 500 MG PO TABS: 500.0000 mg | ORAL_TABLET | Freq: Two times a day (BID) | ORAL | Status: AC

## 2015-09-11 MED ORDER — CEPHALEXIN 500 MG PO CAPS: 500.0000 mg | ORAL_CAPSULE | Freq: Two times a day (BID) | ORAL | Status: AC

## 2015-09-11 MED ORDER — HYDROCODONE-ACETAMINOPHEN 5-325 MG PO TABS: 1.0000 | ORAL_TABLET | Freq: Two times a day (BID) | ORAL | Status: AC | PRN

## 2015-09-11 MED ORDER — CEPHALEXIN 500 MG PO CAPS: 500.0000 mg | ORAL_CAPSULE | Freq: Two times a day (BID) | ORAL | Status: DC

## 2015-09-11 NOTE — Progress Notes (Signed)
Verbally understood DC instructions, Handout printed in [place] and [place].

## 2015-09-11 NOTE — Discharge Instructions (Signed)
Please take the naproxen twice a day for 7 days or sooner if pain improves Please only take the Hydrocodone if your pain is not improved by the naproxen Please take the antibiotic for 7 days Please follow up in our clinic- we made you an appointment- we have Spanish translating available in the clinic

## 2015-09-11 NOTE — Progress Notes (Signed)
Subjective:  No acute events overnight. Says left wrist swelling has subsided. His right underarm  And shoulder pain is less.  Used translator to communicate in [place] and pt understood and had no further questions   Objective: Vital signs in last 24 hours: Filed Vitals:   09/10/15 1609 09/10/15 1900 09/10/15 2155 09/11/15 0557  BP: 128/81  126/74 122/74  Pulse: 94  93 98  Temp: 100 F (37.8 C) 100 F (37.8 C) 99.3 F (37.4 C) 98.9 F (37.2 C)  TempSrc: Oral  Oral Oral  Resp: Height:  (1.753 m)     Weight: 221 lb (100.245 kg)   232 lb 14.4 oz (105.643 kg)  SpO2: 98%  99% 96%   Weight change:   Intake/Output Summary (Last 24 hours) at 09/11/15 1047 Last data filed at 09/11/15 0918  Gross per 24 hour  Intake 1923.75 ml  Output   2200 ml  Net -276.25 ml   General: Vital signs reviewed. Patient in no acute distress Cardiovascular: regular rate, rhythm, no murmur appreciated  Pulmonary/Chest: Clear to auscultation bilaterally, no wheezes, rales, or rhonchi. Abdominal: Soft, non-tender, non-distended, BS + Skin:  Left wrist swelling and erythema has subsided           No signs of shingles under right arm    Lab Results: Results for orders placed or performed during the hospital encounter of 09/10/15 (from the past 24 hour(s))  I-Stat CG4 Lactic Acid, ED     Status: None   Collection Time: 09/10/15  2:01 PM  Result Value Ref Range   Lactic Acid, Venous 0.85 0.5 - 2.0 mmol/L  HIV antibody     Status: None   Collection Time: 09/10/15  2:01 PM  Result Value Ref Range   HIV Screen 4th Generation wRfx Non Reactive Non Reactive  Urine rapid drug screen (hosp performed)     Status: Abnormal   Collection Time: 09/10/15  4:11 PM  Result Value Ref Range   Opiates POSITIVE (A) NONE DETECTED   Cocaine NONE DETECTED NONE DETECTED   Benzodiazepines NONE DETECTED NONE DETECTED   Amphetamines NONE DETECTED NONE DETECTED   Tetrahydrocannabinol NONE DETECTED NONE  DETECTED   Barbiturates NONE DETECTED NONE DETECTED  Influenza panel by PCR (type A & B, H1N1)     Status: None   Collection Time: 09/10/15  5:28 PM  Result Value Ref Range   Influenza A By PCR NEGATIVE NEGATIVE   Influenza B By PCR NEGATIVE NEGATIVE   H1N1 flu by pcr NOT DETECTED NOT DETECTED  MRSA PCR Screening     Status: None   Collection Time: 09/10/15  6:29 PM  Result Value Ref Range   MRSA by PCR NEGATIVE NEGATIVE  CBC with Differential/Platelet     Status: Abnormal   Collection Time: 09/11/15  7:58 AM  Result Value Ref Range   WBC 13.2 (H) 4.0 - 10.5 K/uL   RBC 4.77 4.22 - 5.81 MIL/uL   Hemoglobin 13.4 13.0 - 17.0 g/dL   HCT 78.2 95.6 - 21.3 %   MCV 83.0 78.0 - 100.0 fL   MCH 28.1 26.0 - 34.0 pg   MCHC 33.8 30.0 - 36.0 g/dL   RDW 08.6 57.8 - 46.9 %   Platelets 274 150 - 400 K/uL   Neutrophils Relative % 58 %   Lymphocytes Relative 25 %   Monocytes Relative 8 %   Eosinophils Relative 1 %   Basophils Relative 0 %  Band Neutrophils 7 %   Metamyelocytes Relative 1 %   Myelocytes 0 %   Promyelocytes Absolute 0 %   Blasts 0 %   nRBC 0 0 /100 WBC   Other 0 %   Neutro Abs 8.7 (H) 1.7 - 7.7 K/uL   Lymphs Abs 3.3 0.7 - 4.0 K/uL   Monocytes Absolute 1.1 (H) 0.1 - 1.0 K/uL   Eosinophils Absolute 0.1 0.0 - 0.7 K/uL   Basophils Absolute 0.0 0.0 - 0.1 K/uL   WBC Morphology MILD LEFT SHIFT (1-5% METAS, OCC MYELO, OCC BANDS)   Comprehensive metabolic panel     Status: Abnormal   Collection Time: 09/11/15  7:58 AM  Result Value Ref Range   Sodium 135 135 - 145 mmol/L   Potassium 3.4 (L) 3.5 - 5.1 mmol/L   Chloride 98 (L) 101 - 111 mmol/L   CO2 27 22 - 32 mmol/L   Glucose, Bld 107 (H) 65 - 99 mg/dL   BUN <5 (L) 6 - 20 mg/dL   Creatinine, Ser 0.86 0.61 - 1.24 mg/dL   Calcium 8.4 (L) 8.9 - 10.3 mg/dL   Total Protein 6.7 6.5 - 8.1 g/dL   Albumin 2.4 (L) 3.5 - 5.0 g/dL   AST 23 15 - 41 U/L   ALT 50 17 - 63 U/L   Alkaline Phosphatase 111 38 - 126 U/L   Total Bilirubin 0.5  0.3 - 1.2 mg/dL   GFR calc non Af Amer >60 >60 mL/min   GFR calc Af Amer >60 >60 mL/min   Anion gap 10 5 - 15    Micro Results: Recent Results (from the past 240 hour(s))  Rapid strep screen     Status: Abnormal   Collection Time: 09/08/15  5:20 PM  Result Value Ref Range Status   Streptococcus, Group A Screen (Direct) POSITIVE (A) NEGATIVE Final  MRSA PCR Screening     Status: None   Collection Time: 09/10/15  6:29 PM  Result Value Ref Range Status   MRSA by PCR NEGATIVE NEGATIVE Final    Comment:        The GeneXpert MRSA Assay (FDA approved for NASAL specimens only), is one component of a comprehensive MRSA colonization surveillance program. It is not intended to diagnose MRSA infection nor to guide or monitor treatment for MRSA infections.    Studies/Results: Dg Chest 2 View  09/10/2015  CLINICAL DATA:  Fever and left arm swelling EXAM: CHEST  2 VIEW COMPARISON:  None. FINDINGS: Lungs are clear. Heart size and pulmonary vascularity normal. No adenopathy. No bone lesions. IMPRESSION: No edema or consolidation. Electronically Signed   By: Bretta Bang III M.D.   On: 09/10/2015 09:16   Dg Wrist Complete Left  09/10/2015  CLINICAL DATA:  Redness, swelling, and pain in the left wrist beginning 2 days ago. Fever. No injury. EXAM: LEFT WRIST - COMPLETE 3+ VIEW COMPARISON:  None. FINDINGS: There is diffuse soft tissue swelling about the wrist. No acute fracture or dislocation is seen. Joint space widths are preserved. No osseous erosion. No radiopaque foreign body. Normal bone mineralization. IMPRESSION: Soft tissue swelling without acute osseous abnormality identified. Electronically Signed   By: Sebastian Ache M.D.   On: 09/10/2015 11:52   Ct Angio Chest Pe W/cm &/or Wo Cm  09/10/2015  CLINICAL DATA:  Right upper lateral chest pain and shortness of breath. EXAM: CT ANGIOGRAPHY CHEST WITH CONTRAST TECHNIQUE: Multidetector CT imaging of the chest was performed using the standard  protocol during  bolus administration of intravenous contrast. Multiplanar CT image reconstructions and MIPs were obtained to evaluate the vascular anatomy. CONTRAST:  75mL OMNIPAQUE IOHEXOL 350 MG/ML SOLN COMPARISON:  None. FINDINGS: Mediastinum/Lymph Nodes: Some of the most peripheral subsegmental pulmonary arteries are difficult to definitively characterize due to suboptimal contrast opacification and mild patient breathing motion artifact, but there is no pulmonary embolism identified within the main, lobar, segmental or central subsegmental pulmonary arteries bilaterally. Thoracic aorta is normal in caliber and configuration. No aortic aneurysm or dissection. Heart size is normal. No pericardial effusion. No mass or enlarged lymph nodes within the mediastinum or perihilar regions. Trachea and central bronchi are unremarkable. Lungs/Pleura: Small peripheral consolidations are seen with lower aspects of the left upper lobe and lateral aspects of the right middle lobe. Additional patchy ground-glass opacities are seen within the right lower lobe. No pleural effusion.  No pneumothorax. Upper abdomen: Limited images of the upper abdomen are unremarkable. Musculoskeletal: Superficial soft tissues are unremarkable. No osseous abnormality. Review of the MIP images confirms the above findings. IMPRESSION: 1. Small peripheral consolidations within the inferior aspect of the left upper lobe and lateral aspect of the right middle lobe, with additional ground-glass opacities within the right lower lobe. This could represent atypical infection such as fungal or viral, or respiratory bronchiolitis. Alternatively, these may merely represent areas of atelectasis. Lungs otherwise clear. No pleural effusion. 2. No pulmonary embolism seen, with mild study limitations detailed above. 3. No aortic aneurysm or dissection. 4. Heart size is normal.  No pericardial effusion. Electronically Signed   By: Bary Richard M.D.   On: 09/10/2015  13:17   Medications: I have reviewed the patient's current medications. Scheduled Meds: . cephALEXin  500 mg Oral Q12H   Continuous Infusions:  PRN Meds:.acetaminophen **OR** acetaminophen, HYDROcodone-acetaminophen, ketorolac, sodium chloride flush Assessment/Plan: Principal Problem:   Cellulitis of wrist Active Problems:   Right shoulder pain   Hypokalemia   Viral pneumonitis   Right pleuritic chest pain: most likely viral atypical pneumonitis . HIV negative.Flu PCR negative. Blood cultures are still pending.  Pt looked much improved from yesterday and his pain also less. He denied shortness of breath or chest pain. VSS  -D/c home with Naproxen 500 mg BID for 7 days, and 10 tablets of hydrocodone PRN    Left volar wrist erythema and swelling likely cellulitis:  Cellulitis improved after initiation of antibiotics. Received doxycycline yesterday and switched to ancef.  -home with Keflex for 7 days, and follow up in clinic   Mild transaminitis: Could be due to tylenol or NAFLD. Unlikely to be alcohol. His AST and ALT have normalized.     Dispo: Disposition is deferred at this time, awaiting improvement of current medical problems.  Anticipated discharge in approximately 0 day(s).   The patient does not have a current PCP (No primary care provider on file.) and does need an [facility] hospital follow-up appointment after discharge.  The patient does not have transportation limitations that hinder transportation to clinic appointments.  .Services Needed at time of discharge: Y = Yes, Blank = No PT:   OT:   RN:   Equipment:   Other:       Deneise Lever, MD 09/11/2015, 10:47 AM

## 2015-09-11 NOTE — Progress Notes (Signed)
Subjective: Overnight he slept well throughout the night. This morning he mentioned that the left wrist swelling has decreased but is still has complaints about the right sided lateral pain. Objective: Vital signs in last 24 hours: Filed Vitals:   09/10/15 1609 09/10/15 1900 09/10/15 2155 09/11/15 0557  BP: 128/81  126/74 122/74  Pulse: 94  93 98  Temp: 100 F (37.8 C) 100 F (37.8 C) 99.3 F (37.4 C) 98.9 F (37.2 C)  TempSrc: Oral  Oral Oral  Resp: Height:  (1.753 m)     Weight: 100.245 kg (221 lb)   105.643 kg (232 lb 14.4 oz)  SpO2: 98%  99% 96%   Weight change:   Intake/Output Summary (Last 24 hours) at 09/11/15 1141 Last data filed at 09/11/15 1610  Gross per 24 hour  Intake 1923.75 ml  Output   2200 ml  Net -276.25 ml   Physical Exam  Constitutional: He is oriented to person, place, and time and well-developed, well-nourished, and in no distress.  HENT:  Head: Normocephalic and atraumatic.  Eyes: EOM are normal. No scleral icterus.  Cardiovascular: Normal rate, regular rhythm and normal heart sounds.  Exam reveals no gallop and no friction rub.   No murmur heard. Pulmonary/Chest: Effort normal and breath sounds normal.  Abdominal: Soft. Bowel sounds are normal. He exhibits no distension. There is no tenderness. There is no rebound and no guarding.  Musculoskeletal:  Minimal edema noted on the left volar wrist.  Neurological: He is alert and oriented to person, place, and time.  Skin:  Skin warm, dry and intact no signs of vesicular rash noted on right lateral chest or upper back.  Psychiatric: Affect and judgment normal.   Lab Results: CMP     Component Value Date/Time   NA 135 09/11/2015 0758   K 3.4* 09/11/2015 0758   CL 98* 09/11/2015 0758   CO2 27 09/11/2015 0758   GLUCOSE 107* 09/11/2015 0758   BUN <5* 09/11/2015 0758   CREATININE 0.77 09/11/2015 0758   CALCIUM 8.4* 09/11/2015 0758   PROT 6.7 09/11/2015 0758   ALBUMIN 2.4*  09/11/2015 0758   AST 23 09/11/2015 0758   ALT 50 09/11/2015 0758   ALKPHOS 111 09/11/2015 0758   BILITOT 0.5 09/11/2015 0758   GFRNONAA >60 09/11/2015 0758   GFRAA >60 09/11/2015 0758     Micro Results: Recent Results (from the past 240 hour(s))  Rapid strep screen     Status: Abnormal   Collection Time: 09/08/15  5:20 PM  Result Value Ref Range Status   Streptococcus, Group A Screen (Direct) POSITIVE (A) NEGATIVE Final  MRSA PCR Screening     Status: None   Collection Time: 09/10/15  6:29 PM  Result Value Ref Range Status   MRSA by PCR NEGATIVE NEGATIVE Final    Comment:        The GeneXpert MRSA Assay (FDA approved for NASAL specimens only), is one component of a comprehensive MRSA colonization surveillance program. It is not intended to diagnose MRSA infection nor to guide or monitor treatment for MRSA infections.    Studies/Results: Dg Chest 2 View  09/10/2015  CLINICAL DATA:  Fever and left arm swelling EXAM: CHEST  2 VIEW COMPARISON:  None. FINDINGS: Lungs are clear. Heart size and pulmonary vascularity normal. No adenopathy. No bone lesions. IMPRESSION: No edema or consolidation. Electronically Signed   By: Bretta Bang III M.D.   On: 09/10/2015 09:16   Dg  Wrist Complete Left  09/10/2015  CLINICAL DATA:  Redness, swelling, and pain in the left wrist beginning 2 days ago. Fever. No injury. EXAM: LEFT WRIST - COMPLETE 3+ VIEW COMPARISON:  None. FINDINGS: There is diffuse soft tissue swelling about the wrist. No acute fracture or dislocation is seen. Joint space widths are preserved. No osseous erosion. No radiopaque foreign body. Normal bone mineralization. IMPRESSION: Soft tissue swelling without acute osseous abnormality identified. Electronically Signed   By: Sebastian Ache M.D.   On: 09/10/2015 11:52   Ct Angio Chest Pe W/cm &/or Wo Cm  09/10/2015  CLINICAL DATA:  Right upper lateral chest pain and shortness of breath. EXAM: CT ANGIOGRAPHY CHEST WITH CONTRAST  TECHNIQUE: Multidetector CT imaging of the chest was performed using the standard protocol during bolus administration of intravenous contrast. Multiplanar CT image reconstructions and MIPs were obtained to evaluate the vascular anatomy. CONTRAST:  75mL OMNIPAQUE IOHEXOL 350 MG/ML SOLN COMPARISON:  None. FINDINGS: Mediastinum/Lymph Nodes: Some of the most peripheral subsegmental pulmonary arteries are difficult to definitively characterize due to suboptimal contrast opacification and mild patient breathing motion artifact, but there is no pulmonary embolism identified within the main, lobar, segmental or central subsegmental pulmonary arteries bilaterally. Thoracic aorta is normal in caliber and configuration. No aortic aneurysm or dissection. Heart size is normal. No pericardial effusion. No mass or enlarged lymph nodes within the mediastinum or perihilar regions. Trachea and central bronchi are unremarkable. Lungs/Pleura: Small peripheral consolidations are seen with lower aspects of the left upper lobe and lateral aspects of the right middle lobe. Additional patchy ground-glass opacities are seen within the right lower lobe. No pleural effusion.  No pneumothorax. Upper abdomen: Limited images of the upper abdomen are unremarkable. Musculoskeletal: Superficial soft tissues are unremarkable. No osseous abnormality. Review of the MIP images confirms the above findings. IMPRESSION: 1. Small peripheral consolidations within the inferior aspect of the left upper lobe and lateral aspect of the right middle lobe, with additional ground-glass opacities within the right lower lobe. This could represent atypical infection such as fungal or viral, or respiratory bronchiolitis. Alternatively, these may merely represent areas of atelectasis. Lungs otherwise clear. No pleural effusion. 2. No pulmonary embolism seen, with mild study limitations detailed above. 3. No aortic aneurysm or dissection. 4. Heart size is normal.  No  pericardial effusion. Electronically Signed   By: Bary Richard M.D.   On: 09/10/2015 13:17   Medications:  Scheduled Meds: . cephALEXin  500 mg Oral Q12H   Continuous Infusions:  PRN Meds:.acetaminophen **OR** acetaminophen, HYDROcodone-acetaminophen, ketorolac, sodium chloride flush Assessment/Plan: Principal Problem:   Cellulitis of wrist Active Problems:   Right shoulder pain   Hypokalemia   Viral pneumonitis  Mr. Joshua Keith Reason is a 35 year old Hispanic male with history of intermittent asthma, tobacco, alcohol and recent cocaine use that presented with an erythematous left wrist on the volar aspect and right shoulder/right hypochondriac region. Labs and imaging done in ED showed a mild leukocytosis, ALT 78: AST 43 and CT scan showed ground glass opacities. This morning the mild leukocytosis is still present and basically unchanged, but his transaminitis has resolved.  Right pleuritic flank pain - For his viral pneumonia is the cause of for his presentation. This is self resolving and we will give him some naproxen for the associated pleuritic pain he is having.  Left hand/wrist uncomplicated cellulitis -Surgical intervention is not indicated per orthopedics.  -Cellulitis improving on antibiotics, based on improving uncomplicated cellulitis treatment regimen should be for 5  days, will discharge on Keflex with f/u at Internal med clinic to assess for resolution of cellulitis.  Hypokalemia - of K+ given yesterday on admission for mildly low K+ 3.3 on admission. Today 3.4 K+ which is unexpected given lack of vomiting or diarrhea during hospital stay. Expected K+ goal with was around  3.8. Given his age and fairly healthy status, PO intake should resolve hypokalemia, further K+ administration is not warranted at this time.  This is a Psychologist, occupational Note.  The care of the patient was discussed with Dr.  and the assessment and pln formulated with their assistance.  Please see their  attached note for official documentation of the daily encounter.     Kandyce Rud., Med Student 09/11/2015, 11:41 AM

## 2015-09-11 NOTE — Progress Notes (Signed)
Pt verbally understood DC notes, no questions asked

## 2015-09-11 NOTE — Discharge Summary (Signed)
Name: Joshua Keith MRN: [id number] DOB: 12/02/1980 35 y.o. PCP: No primary care provider on file.  Date of Admission: 09/10/2015  8:54 AM Date of Discharge: 09/11/2015 Attending Physician: Doneen Poisson, MD  Discharge Diagnosis: 1. Cellulitis of wrist, viral pneumonitis  Principal Problem:   Cellulitis of wrist Active Problems:   Right shoulder pain   Hypokalemia   Viral pneumonitis  Discharge Medications:   Medication List    STOP taking these medications        ibuprofen 800 MG tablet  Commonly known as:  ADVIL,MOTRIN      TAKE these medications        cephALEXin 500 MG capsule  Commonly known as:  KEFLEX  Take 1 capsule (500 mg total) by mouth every 12 (twelve) hours. For 7 days     HYDROcodone-acetaminophen 5-325 MG tablet  Commonly known as:  NORCO/VICODIN  Take 1 tablet by mouth every 12 (twelve) hours as needed for moderate pain or severe pain (if pain does not improve with naproxen). Given 10 pills.     naproxen 500 MG tablet  Commonly known as:  NAPROSYN  Take 1 tablet (500 mg total) by mouth 2 (two) times daily with a meal. For seven days or sooner if pain improves        Disposition and follow-up:   [name] was discharged from [facility] in Good condition.  At the hospital follow up visit please address:  1.  Right pleuritic flank pain- has the pain improved after the NSAIDs? He is sent home on naproxen 500 mg BID, and hydrocodone-tylenol prn.   Cellulitis of left hand- did pt finish Keflex course and has that improved?  Substance abuse- UDS positive for benzos and he also has alcohol use. Needs counseling and CSW resources  Pre-diabetes- needs A1c check and age appropriate screening including lipid panel  Please re-assess BP   2.  Labs / imaging needed at time of follow-up:   3.  Pending labs/ test needing follow-up:   Follow-up Appointments:     Follow-up Information    Follow up  with Gust Rung, DO On 09/17/2015.   Specialty:  Internal Medicine   Why:  3:15 PM for new PCP   Contact information:   [address]  [place] [address] [phone]       Discharge Instructions: Discharge Instructions    Diet - low sodium heart healthy    Complete by:  As directed      Discharge instructions    Complete by:  As directed   Please take the naproxen twice a day for 7 days or sooner if pain improves Please only take the Hydrocodone if your pain is not improved by the naproxen Please take the antibiotic for 7 days Please follow up in our clinic- we made you an appointment- we have Spanish translating available in the clinic     Increase activity slowly    Complete by:  As directed            Consultations:    Procedures Performed:  Dg Chest 2 View  09/10/2015  CLINICAL DATA:  Fever and left arm swelling EXAM: CHEST  2 VIEW COMPARISON:  None. FINDINGS: Lungs are clear. Heart size and pulmonary vascularity normal. No adenopathy. No bone lesions. IMPRESSION: No edema or consolidation. Electronically Signed   By: Bretta Bang III M.D.   On: 09/10/2015 09:16   Dg Wrist Complete Left  09/10/2015  CLINICAL DATA:  Redness, swelling, and pain in the left wrist beginning 2 days ago. Fever. No injury. EXAM: LEFT WRIST - COMPLETE 3+ VIEW COMPARISON:  None. FINDINGS: There is diffuse soft tissue swelling about the wrist. No acute fracture or dislocation is seen. Joint space widths are preserved. No osseous erosion. No radiopaque foreign body. Normal bone mineralization. IMPRESSION: Soft tissue swelling without acute osseous abnormality identified. Electronically Signed   By: Sebastian Ache M.D.   On: 09/10/2015 11:52   Ct Angio Chest Pe W/cm &/or Wo Cm  09/10/2015  CLINICAL DATA:  Right upper lateral chest pain and shortness of breath. EXAM: CT ANGIOGRAPHY CHEST WITH CONTRAST TECHNIQUE: Multidetector CT imaging of the chest was performed using the standard protocol  during bolus administration of intravenous contrast. Multiplanar CT image reconstructions and MIPs were obtained to evaluate the vascular anatomy. CONTRAST:  75mL OMNIPAQUE IOHEXOL 350 MG/ML SOLN COMPARISON:  None. FINDINGS: Mediastinum/Lymph Nodes: Some of the most peripheral subsegmental pulmonary arteries are difficult to definitively characterize due to suboptimal contrast opacification and mild patient breathing motion artifact, but there is no pulmonary embolism identified within the main, lobar, segmental or central subsegmental pulmonary arteries bilaterally. Thoracic aorta is normal in caliber and configuration. No aortic aneurysm or dissection. Heart size is normal. No pericardial effusion. No mass or enlarged lymph nodes within the mediastinum or perihilar regions. Trachea and central bronchi are unremarkable. Lungs/Pleura: Small peripheral consolidations are seen with lower aspects of the left upper lobe and lateral aspects of the right middle lobe. Additional patchy ground-glass opacities are seen within the right lower lobe. No pleural effusion.  No pneumothorax. Upper abdomen: Limited images of the upper abdomen are unremarkable. Musculoskeletal: Superficial soft tissues are unremarkable. No osseous abnormality. Review of the MIP images confirms the above findings. IMPRESSION: 1. Small peripheral consolidations within the inferior aspect of the left upper lobe and lateral aspect of the right middle lobe, with additional ground-glass opacities within the right lower lobe. This could represent atypical infection such as fungal or viral, or respiratory bronchiolitis. Alternatively, these may merely represent areas of atelectasis. Lungs otherwise clear. No pleural effusion. 2. No pulmonary embolism seen, with mild study limitations detailed above. 3. No aortic aneurysm or dissection. 4. Heart size is normal.  No pericardial effusion. Electronically Signed   By: Bary Richard M.D.   On: 09/10/2015 13:17     2D Echo:   Cardiac Cath:   Admission HPI:  35 yo Joshua Keith patient with no significant past medical history here for right shoulder pain and right under arm pain, and also erythema of his left volar wrist.  Pt says that he was here in the ER on Monday for sore throat and fever and he tested positive for GAS and given bicillin shot and sent home on ibuprofen. Since then his fevers have subsided, but he started having the right shoulder pain since yesterday,. The pain is 10/10 and sharp and is mainly under his right arm in a dermatomal fashion. The pain stayed about the same overnight and it does not radiate anywhere else. He denies abdominal pain or chest pain. He denies shortness of breath. He denies any injury or trauma or heavy lifting. He said he noticed his pain yesterday when he was lifting his laundry. The pain worsens when he takes a deep breath and improves if he lays still. It is somewhat relieved by advil. Regarding his GAS, he denies any cough, sore throat, trouble swallowing, or rash. He  denies any neck pain or rigidity.   About his left wrist cellulitis, he says it started last Monday the day prior to him coming to the ER, but it has since progressed to the point where it is hard for him to flex his wrist. He denies any injury or trauma. He is unsure if he had an insect bite. He denies any hiking activity or going to the woods. He denies iV drug use there.   Patient denies weight changes, change in appetite, night sweats, n/v. He did have some diarrhea that resolved 2 days ago, up to 3 loose bowel movements per day. He denies melena or hematochezia. No dysuria or hematuria. No penile discharge.   Family history: negative for diabetes and hypertension.  Social history: Patient drinks upto "24 beers at a time" but not every day, and last drink was last Friday. He smokes about 0.5 pack per day for atleast 5 years. He denies IV drug use now or in the past, but does snort  cocaine. Last time used was last week. No other illicit drug use. He denies being sexually active with anyone in the last year, last activity at least "few years ago" and with woman. 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 He moved from [place] 17 years ago. No recent history of travel.  He does not have a PCP.    Hospital Course by problem list: Principal Problem:   Cellulitis of wrist Active Problems:   Right shoulder pain   Hypokalemia   Viral pneumonitis    Right pleuritic chest pain: most likely viral atypical pneumonitis . HIV negative.Flu PCR negative.His EKG entirely normal. His troponin normal. Dont think it is PE. It is unlikely to be GA strep as we do not see any consolidation on the ct angio. Pt has mainly ground glass opacities in the right lower lobe. His pain is probably where the ground glass opacities are seen on CT angio. I do not think it is shingles as pt does not appear to be immunocompromised and he had no systemic symptoms. We re-checked for shingles on the day of discharge and there were no lesions. He had no weight loss. He denies any productive cough, fevers after Monday or other symptoms. Patient denies any travel, and no sick exposures. He received toradol and percoset for his pain.  On the day of discharge, pt looked much improved from yesterday and his pain also less. He denied shortness of breath or chest pain. VSS. He is being sent home with Naproxen 500 mg BID for 7 days, and 10 tablets of hydrocodone PRN and asked to follow up in clinic.  Left volar wrist erythema and swelling likely cellulitis: Cellulitis improved after initiation of antibiotics. Received doxycycline yesterday and switched to ancef. We consulted hand surgery who saw him yesterday and they did not think the hand was neurovascularly compromised. On the next day, his cellulitis improved greatly and he was able to make a fist, and he is being sent home with Keflex for 7  days, and follow up in clinic.  Mild transaminitis: Could be due to tylenol or NAFLD. Unlikely to be alcohol. His AST and ALT have normalized.  Hypokalemia Given 40 mew klor con  Discharge Vitals:   BP 122/74 mmHg  Pulse 98  Temp(Src) 98.9 F (37.2 C) (Oral)  Resp 19  Ht 5\' 9"  (1.753 m)  Wt 232 lb 14.4 oz (105.643 kg)  BMI 34.38 kg/m2  SpO2 96%  Discharge  Labs:  Results for orders placed or performed during the hospital encounter of 09/10/15 (from the past 24 hour(s))  Uric acid     Status: None   Collection Time: 09/10/15 11:40 AM  Result Value Ref Range   Uric Acid, Serum 4.7 4.4 - 7.6 mg/dL  Troponin I     Status: None   Collection Time: 09/10/15 11:40 AM  Result Value Ref Range   Troponin I <0.03 <0.031 ng/mL  I-Stat CG4 Lactic Acid, ED     Status: None   Collection Time: 09/10/15 11:55 AM  Result Value Ref Range   Lactic Acid, Venous 0.88 0.5 - 2.0 mmol/L  I-Stat CG4 Lactic Acid, ED     Status: None   Collection Time: 09/10/15  2:01 PM  Result Value Ref Range   Lactic Acid, Venous 0.85 0.5 - 2.0 mmol/L  HIV antibody     Status: None   Collection Time: 09/10/15  2:01 PM  Result Value Ref Range   HIV Screen 4th Generation wRfx Non Reactive Non Reactive  Urine rapid drug screen (hosp performed)     Status: Abnormal   Collection Time: 09/10/15  4:11 PM  Result Value Ref Range   Opiates POSITIVE (A) NONE DETECTED   Cocaine NONE DETECTED NONE DETECTED   Benzodiazepines NONE DETECTED NONE DETECTED   Amphetamines NONE DETECTED NONE DETECTED   Tetrahydrocannabinol NONE DETECTED NONE DETECTED   Barbiturates NONE DETECTED NONE DETECTED  Influenza panel by PCR (type A & B, H1N1)     Status: None   Collection Time: 09/10/15  5:28 PM  Result Value Ref Range   Influenza A By PCR NEGATIVE NEGATIVE   Influenza B By PCR NEGATIVE NEGATIVE   H1N1 flu by pcr NOT DETECTED NOT DETECTED  MRSA PCR Screening     Status: None   Collection Time: 09/10/15  6:29 PM  Result Value Ref  Range   MRSA by PCR NEGATIVE NEGATIVE  CBC with Differential/Platelet     Status: Abnormal (Preliminary result)   Collection Time: 09/11/15  7:58 AM  Result Value Ref Range   WBC 13.2 (H) 4.0 - 10.5 K/uL   RBC 4.77 4.22 - 5.81 MIL/uL   Hemoglobin 13.4 13.0 - 17.0 g/dL   HCT 40.9 81.1 - 91.4 %   MCV 83.0 78.0 - 100.0 fL   MCH 28.1 26.0 - 34.0 pg   MCHC 33.8 30.0 - 36.0 g/dL   RDW 78.2 95.6 - 21.3 %   Platelets 274 150 - 400 K/uL   Neutrophils Relative % PENDING %   Neutro Abs PENDING 1.7 - 7.7 K/uL   Band Neutrophils PENDING %   Lymphocytes Relative PENDING %   Lymphs Abs PENDING 0.7 - 4.0 K/uL   Monocytes Relative PENDING %   Monocytes Absolute PENDING 0.1 - 1.0 K/uL   Eosinophils Relative PENDING %   Eosinophils Absolute PENDING 0.0 - 0.7 K/uL   Basophils Relative PENDING %   Basophils Absolute PENDING 0.0 - 0.1 K/uL   WBC Morphology PENDING    RBC Morphology PENDING    Smear Review PENDING    nRBC PENDING 0 /100 WBC   Metamyelocytes Relative PENDING %   Myelocytes PENDING %   Promyelocytes Absolute PENDING %   Blasts PENDING %  Comprehensive metabolic panel     Status: Abnormal   Collection Time: 09/11/15  7:58 AM  Result Value Ref Range   Sodium 135 135 - 145 mmol/L   Potassium 3.4 (L) 3.5 - 5.1  mmol/L   Chloride 98 (L) 101 - 111 mmol/L   CO2 27 22 - 32 mmol/L   Glucose, Bld 107 (H) 65 - 99 mg/dL   BUN <5 (L) 6 - 20 mg/dL   Creatinine, Ser 0.98 0.61 - 1.24 mg/dL   Calcium 8.4 (L) 8.9 - 10.3 mg/dL   Total Protein 6.7 6.5 - 8.1 g/dL   Albumin 2.4 (L) 3.5 - 5.0 g/dL   AST 23 15 - 41 U/L   ALT 50 17 - 63 U/L   Alkaline Phosphatase 111 38 - 126 U/L   Total Bilirubin 0.5 0.3 - 1.2 mg/dL   GFR calc non Af Amer >60 >60 mL/min   GFR calc Af Amer >60 >60 mL/min   Anion gap 10 5 - 15    Signed: Deneise Lever, MD 09/11/2015, 11:22 AM    Services Ordered on Discharge:   Equipment Ordered on Discharge:

## 2015-09-11 NOTE — Progress Notes (Signed)
Interpreter Wyvonnia Dusky for Madelaine Bhat, [employer]

## 2015-09-11 NOTE — Care Management Note (Signed)
Case Management Note  Patient Details  Name: Hawkin Charo MRN: [id number] Date of Birth: 08-11-1980  Subjective/Objective:                    Action/Plan:   Expected Discharge Date:                  Expected Discharge Plan:  Home/Self Care  In-House Referral:     Discharge planning Services  CM Consult, MATCH Program, Medication Assistance, Indigent Health Clinic  Post Acute Care Choice:    Choice offered to:  Patient  DME Arranged:    DME Agency:     HH Arranged:    HH Agency:     Status of Service:  Completed, signed off  Medicare Important Message Given:    Date Medicare IM Given:    Medicare IM give by:    Date Additional Medicare IM Given:    Additional Medicare Important Message give by:     If discussed at Long Length of Stay Meetings, dates discussed:    Additional Comments:  Kingsley Plan, RN 09/11/2015, 2:12 PM

## 2015-09-12 LAB — PATHOLOGIST SMEAR REVIEW

## 2015-09-15 LAB — CULTURE, BLOOD (ROUTINE X 2)
CULTURE: NO GROWTH
Culture: NO GROWTH

## 2015-09-17 ENCOUNTER — Ambulatory Visit: Payer: Self-pay | Admitting: Internal Medicine

## 2018-02-06 IMAGING — CR DG WRIST COMPLETE 3+V*L*
4 series · 4 of 4 positions shown · non-contrast
Comparison: None.

CLINICAL DATA: Redness, swelling, and pain in the left wrist
beginning 2 days ago. Fever. No injury.

EXAM:
LEFT WRIST - COMPLETE 3+ VIEW

[wrist pa]
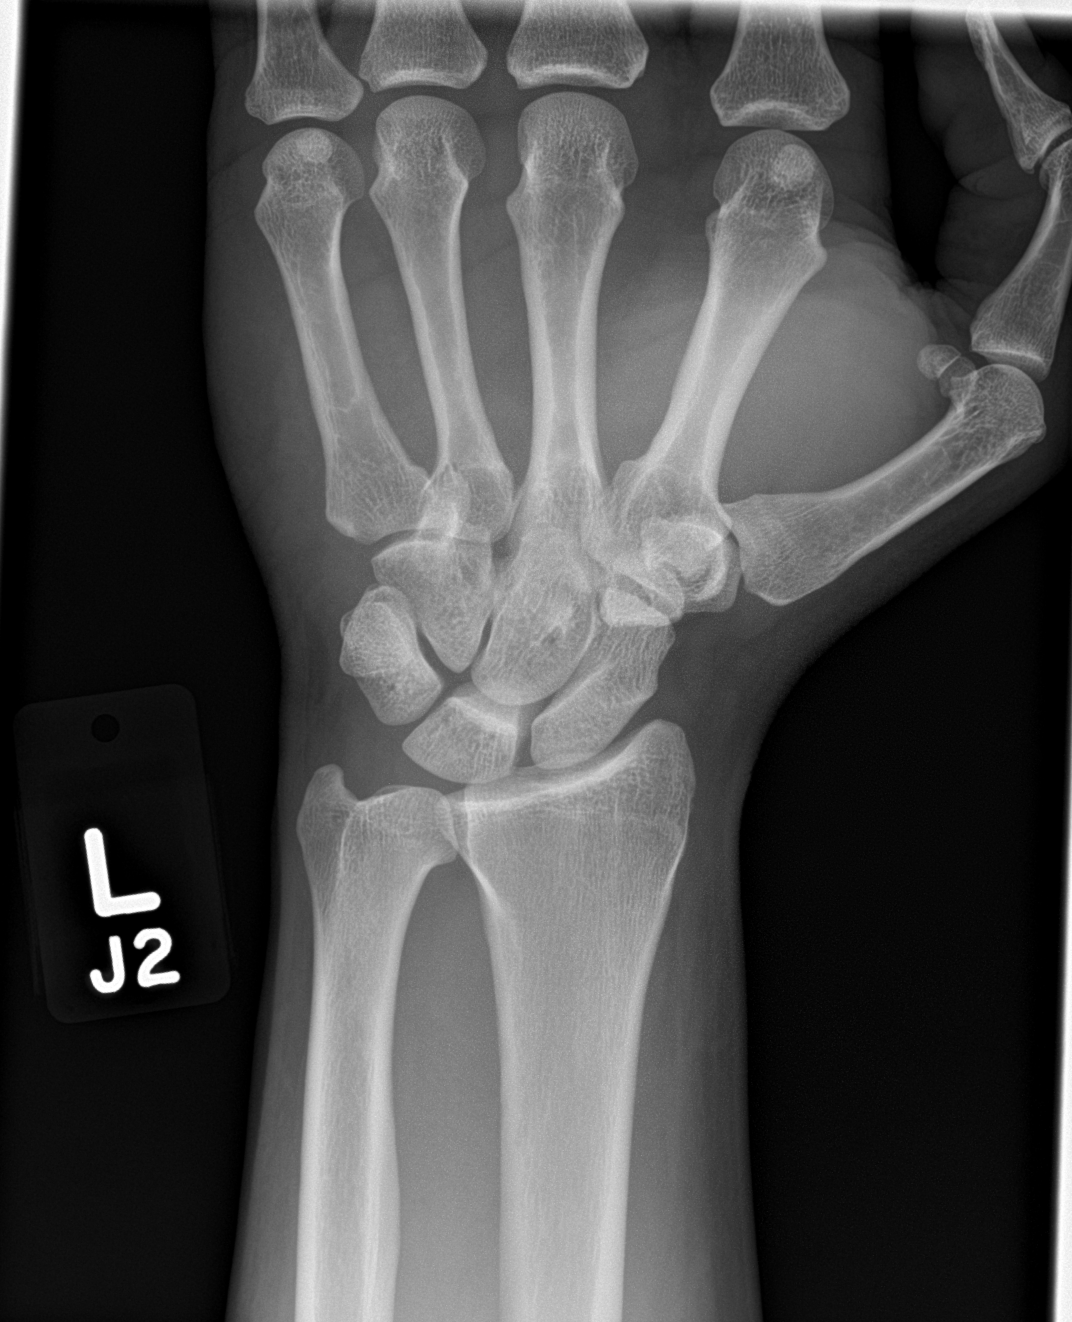

[wrist obl]
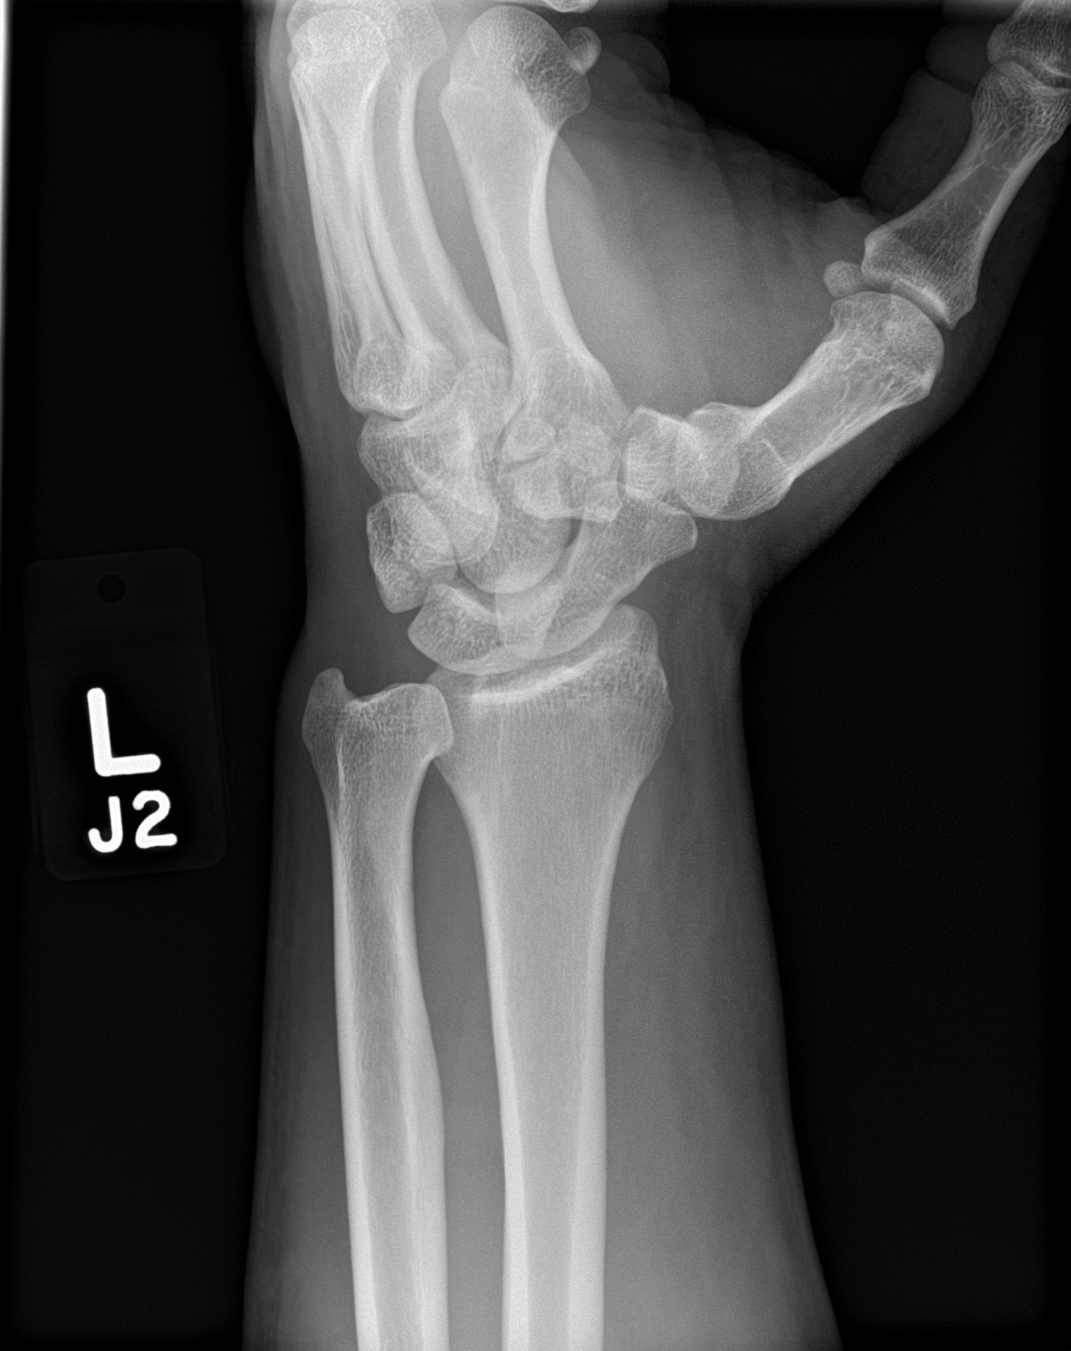

[wrist lat]
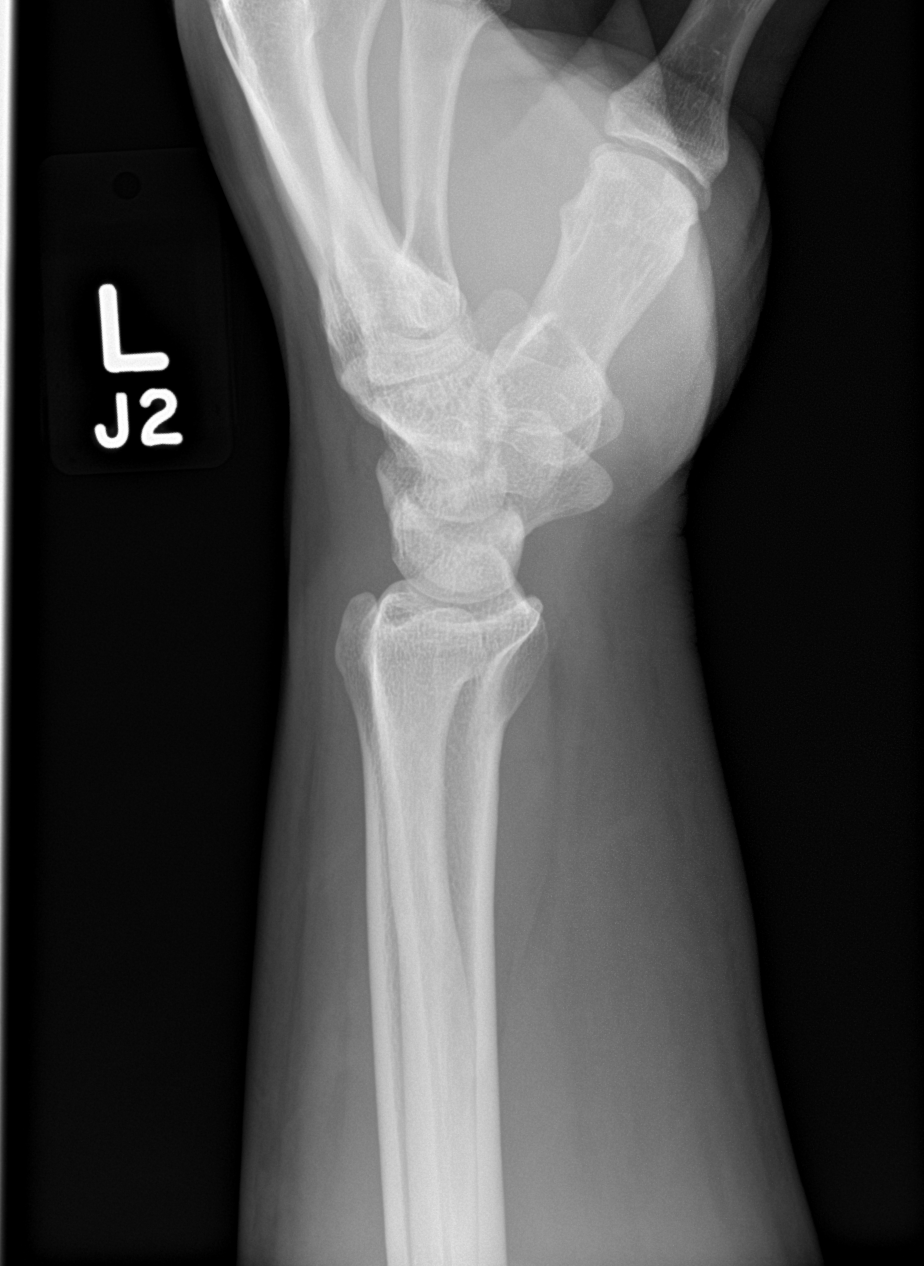

[wrist navicular]
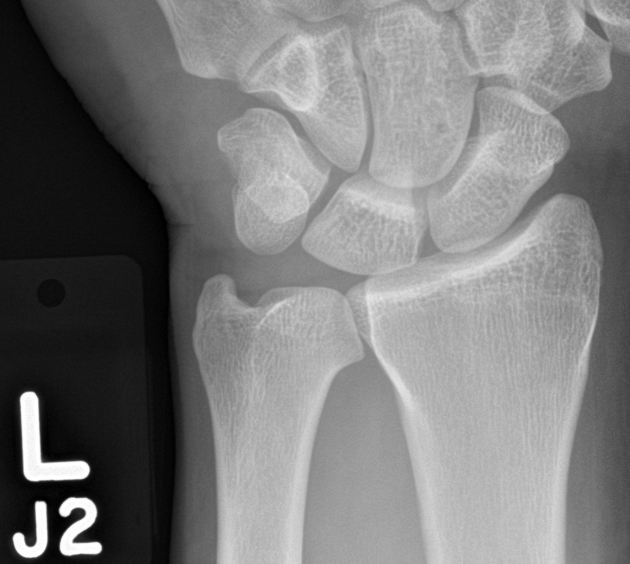

[4 of 4 positions shown; findings below may reference images not displayed]

FINDINGS: There is diffuse soft tissue swelling about the wrist. No acute
fracture or dislocation is seen. Joint space widths are preserved.
No osseous erosion. No radiopaque foreign body. Normal bone
mineralization.
IMPRESSION: Soft tissue swelling without acute osseous abnormality identified.
